# Patient Record
Sex: Female | Born: 1940 | Race: Black or African American | Hispanic: No | Smoking: Never smoker
Health system: Southern US, Community
[De-identification: ages and names within clinical notes are randomized; demographics above are authoritative.]

## PROBLEM LIST (undated history)

## (undated) DIAGNOSIS — I10 Essential (primary) hypertension: Secondary | ICD-10-CM

## (undated) DIAGNOSIS — I509 Heart failure, unspecified: Secondary | ICD-10-CM

## (undated) DIAGNOSIS — C801 Malignant (primary) neoplasm, unspecified: Secondary | ICD-10-CM

## (undated) HISTORY — DX: Heart failure, unspecified: I50.9

## (undated) HISTORY — PX: ABDOMINAL HYSTERECTOMY: SHX81

---

## 2019-03-02 ENCOUNTER — Other Ambulatory Visit: Payer: Self-pay

## 2019-03-02 ENCOUNTER — Emergency Department: Payer: Medicare Other

## 2019-03-02 ENCOUNTER — Inpatient Hospital Stay
Admission: EM | Admit: 2019-03-02 | Discharge: 2019-03-06 | DRG: 683 | Disposition: A | Discharge status: Home Health | Payer: Medicare Other | Attending: Internal Medicine | Admitting: Internal Medicine

## 2019-03-02 DIAGNOSIS — T451X5A Adverse effect of antineoplastic and immunosuppressive drugs, initial encounter: Secondary | ICD-10-CM | POA: Diagnosis present

## 2019-03-02 DIAGNOSIS — E876 Hypokalemia: Secondary | ICD-10-CM | POA: Diagnosis present

## 2019-03-02 DIAGNOSIS — D72819 Decreased white blood cell count, unspecified: Secondary | ICD-10-CM

## 2019-03-02 DIAGNOSIS — I10 Essential (primary) hypertension: Secondary | ICD-10-CM | POA: Diagnosis present

## 2019-03-02 DIAGNOSIS — D709 Neutropenia, unspecified: Secondary | ICD-10-CM | POA: Diagnosis present

## 2019-03-02 DIAGNOSIS — R531 Weakness: Secondary | ICD-10-CM

## 2019-03-02 DIAGNOSIS — N170 Acute kidney failure with tubular necrosis: Secondary | ICD-10-CM | POA: Diagnosis not present

## 2019-03-02 DIAGNOSIS — K5900 Constipation, unspecified: Secondary | ICD-10-CM | POA: Diagnosis present

## 2019-03-02 DIAGNOSIS — N179 Acute kidney failure, unspecified: Secondary | ICD-10-CM | POA: Diagnosis not present

## 2019-03-02 DIAGNOSIS — C8593 Non-Hodgkin lymphoma, unspecified, intra-abdominal lymph nodes: Secondary | ICD-10-CM

## 2019-03-02 DIAGNOSIS — E883 Tumor lysis syndrome: Secondary | ICD-10-CM | POA: Diagnosis present

## 2019-03-02 DIAGNOSIS — K567 Ileus, unspecified: Secondary | ICD-10-CM

## 2019-03-02 DIAGNOSIS — Z20828 Contact with and (suspected) exposure to other viral communicable diseases: Secondary | ICD-10-CM | POA: Diagnosis present

## 2019-03-02 DIAGNOSIS — Z79899 Other long term (current) drug therapy: Secondary | ICD-10-CM

## 2019-03-02 DIAGNOSIS — D696 Thrombocytopenia, unspecified: Secondary | ICD-10-CM | POA: Diagnosis present

## 2019-03-02 DIAGNOSIS — N136 Pyonephrosis: Secondary | ICD-10-CM | POA: Diagnosis present

## 2019-03-02 DIAGNOSIS — C8293 Follicular lymphoma, unspecified, intra-abdominal lymph nodes: Secondary | ICD-10-CM | POA: Diagnosis present

## 2019-03-02 DIAGNOSIS — Z90710 Acquired absence of both cervix and uterus: Secondary | ICD-10-CM

## 2019-03-02 HISTORY — DX: Essential (primary) hypertension: I10

## 2019-03-02 HISTORY — DX: Malignant (primary) neoplasm, unspecified: C80.1

## 2019-03-02 LAB — URINALYSIS, COMPLETE (UACMP) WITH MICROSCOPIC
Bilirubin Urine: NEGATIVE
Glucose, UA: NEGATIVE mg/dL
Ketones, ur: NEGATIVE mg/dL
Nitrite: NEGATIVE
Protein, ur: NEGATIVE mg/dL
Specific Gravity, Urine: 1.005 (ref 1.005–1.030)
pH: 8 (ref 5.0–8.0)

## 2019-03-02 LAB — BASIC METABOLIC PANEL
Anion gap: 8 (ref 5–15)
BUN: 40 mg/dL — ABNORMAL HIGH (ref 8–23)
CO2: 34 mmol/L — ABNORMAL HIGH (ref 22–32)
Calcium: 8.9 mg/dL (ref 8.9–10.3)
Chloride: 97 mmol/L — ABNORMAL LOW (ref 98–111)
Creatinine, Ser: 2.07 mg/dL — ABNORMAL HIGH (ref 0.44–1.00)
GFR calc Af Amer: 26 mL/min — ABNORMAL LOW (ref >60)
GFR calc non Af Amer: 23 mL/min — ABNORMAL LOW (ref >60)
Glucose, Bld: 143 mg/dL — ABNORMAL HIGH (ref 70–99)
Potassium: 3.4 mmol/L — ABNORMAL LOW (ref 3.5–5.1)
Sodium: 139 mmol/L (ref 135–145)

## 2019-03-02 LAB — CBC WITH DIFFERENTIAL/PLATELET
Abs Immature Granulocytes: 0.01 10*3/uL (ref 0.00–0.07)
Basophils Absolute: 0 10*3/uL (ref 0.0–0.1)
Basophils Relative: 2 %
Eosinophils Absolute: 0 10*3/uL (ref 0.0–0.5)
Eosinophils Relative: 0 %
HCT: 39.8 % (ref 36.0–46.0)
Hemoglobin: 12.6 g/dL (ref 12.0–15.0)
Immature Granulocytes: 2 %
Lymphocytes Relative: 39 %
Lymphs Abs: 0.2 10*3/uL — ABNORMAL LOW (ref 0.7–4.0)
MCH: 27 pg (ref 26.0–34.0)
MCHC: 31.7 g/dL (ref 30.0–36.0)
MCV: 85.4 fL (ref 80.0–100.0)
Monocytes Absolute: 0.1 10*3/uL (ref 0.1–1.0)
Monocytes Relative: 21 %
Neutro Abs: 0.2 10*3/uL — ABNORMAL LOW (ref 1.7–7.7)
Neutrophils Relative %: 36 %
Platelets: 37 10*3/uL — ABNORMAL LOW (ref 150–400)
RBC: 4.66 MIL/uL (ref 3.87–5.11)
RDW: 15.5 % (ref 11.5–15.5)
Smear Review: NORMAL
WBC: 0.5 10*3/uL — CL (ref 4.0–10.5)
nRBC: 0 % (ref 0.0–0.2)

## 2019-03-02 LAB — HEPATIC FUNCTION PANEL
ALT: 48 U/L — ABNORMAL HIGH (ref 0–44)
AST: 22 U/L (ref 15–41)
Albumin: 3.3 g/dL — ABNORMAL LOW (ref 3.5–5.0)
Alkaline Phosphatase: 61 U/L (ref 38–126)
Bilirubin, Direct: 0.5 mg/dL — ABNORMAL HIGH (ref 0.0–0.2)
Indirect Bilirubin: 1.1 mg/dL — ABNORMAL HIGH (ref 0.3–0.9)
Total Bilirubin: 1.6 mg/dL — ABNORMAL HIGH (ref 0.3–1.2)
Total Protein: 6.6 g/dL (ref 6.5–8.1)

## 2019-03-02 LAB — SARS CORONAVIRUS 2 BY RT PCR (HOSPITAL ORDER, PERFORMED IN ~~LOC~~ HOSPITAL LAB): SARS Coronavirus 2: NEGATIVE

## 2019-03-02 LAB — LIPASE, BLOOD: Lipase: 19 U/L (ref 11–51)

## 2019-03-02 MED ORDER — ONDANSETRON HCL 4 MG/2ML IJ SOLN
4.0000 mg | Freq: Once | INTRAMUSCULAR | Status: AC
  Administered 2019-03-02: 4 mg via INTRAVENOUS
  Filled 2019-03-02: qty 2

## 2019-03-02 MED ORDER — AMLODIPINE BESYLATE 10 MG PO TABS
10.0000 mg | ORAL_TABLET | Freq: Every day | ORAL | Status: DC
  Administered 2019-03-03 – 2019-03-06 (×4): 10 mg via ORAL
  Filled 2019-03-02 (×4): qty 1

## 2019-03-02 MED ORDER — SENNA 8.6 MG PO TABS
1.0000 | ORAL_TABLET | Freq: Every day | ORAL | Status: DC
  Administered 2019-03-02 – 2019-03-04 (×3): 8.6 mg via ORAL
  Filled 2019-03-02 (×3): qty 1

## 2019-03-02 MED ORDER — SODIUM CHLORIDE 0.9 % IV SOLN
1.0000 g | INTRAVENOUS | Status: DC
  Administered 2019-03-02 – 2019-03-05 (×4): 1 g via INTRAVENOUS
  Filled 2019-03-02 (×2): qty 1
  Filled 2019-03-02: qty 10
  Filled 2019-03-02 (×2): qty 1

## 2019-03-02 MED ORDER — LORATADINE 10 MG PO TABS
10.0000 mg | ORAL_TABLET | Freq: Every day | ORAL | Status: DC
  Administered 2019-03-03 – 2019-03-06 (×4): 10 mg via ORAL
  Filled 2019-03-02 (×4): qty 1

## 2019-03-02 MED ORDER — SODIUM CHLORIDE 0.9 % IV SOLN
INTRAVENOUS | Status: DC
  Administered 2019-03-02 – 2019-03-04 (×4): via INTRAVENOUS

## 2019-03-02 MED ORDER — LACTATED RINGERS IV BOLUS
1000.0000 mL | Freq: Once | INTRAVENOUS | Status: AC
  Administered 2019-03-02: 1000 mL via INTRAVENOUS

## 2019-03-02 NOTE — ED Notes (Signed)
Called pt's daughter to update her about the pt being moved to 1C

## 2019-03-02 NOTE — ED Triage Notes (Signed)
Patient from cancer center for concerns of constipation and possible bowel obstructions. 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 Last bowel movement last Wednesday. Family reports increased weakness and decreased PO intake.

## 2019-03-02 NOTE — ED Notes (Signed)
ED TO INPATIENT HANDOFF REPORT  ED Nurse Name and Phone #: Charlestine Night, [phone] Name/Age/Gender Christie Farmer 78 y.o. female Room/Bed: ED13A/ED13A  Code Status   Code Status: Full Code  Home/SNF/Other Home Patient oriented to: self, place, time and situation Is this baseline? Yes   Triage Complete: Triage complete  Chief Complaint Constipation   Triage Note Patient from cancer center for concerns of constipation and possible bowel obstructions. 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 Last bowel movement last Wednesday. Family reports increased weakness and decreased PO intake.   Pt comes with c/o constipation. Per family pt has not had BM since last Wednesday. Pt currently being treated for cancer at Cancer center. Per cancer center they are concerned for bowel obstruction. Pt has had loss of appetite.  Pt has also had some vomiting.   Allergies Not on File  Level of Care/Admitting Diagnosis ED Disposition    ED Disposition Condition [facility] Area: [place] [[id number]]  Level of Care: Med-Surg [16]  Covid Evaluation: Asymptomatic Screening Protocol (No Symptoms)  Diagnosis: Generalized weakness [id number]  Admitting Physician: Lang Snow [id number]  Attending Physician: Rufina Falco ACHIENG [[id number]]  PT Class (Do Not Modify): Observation [104]  PT Acc Code (Do Not Modify): Observation [10022]       B Medical/Surgery History Past Medical History:  Diagnosis Date  . Cancer ([place])   . Hypertension    Past Surgical History:  Procedure Laterality Date  . ABDOMINAL HYSTERECTOMY       A IV Location/Drains/Wounds Patient Lines/Drains/Airways Status   Active Line/Drains/Airways    Name:   Placement date:   Placement time:   Site:   Days:   Peripheral IV 03/02/19 Right Antecubital   03/02/19    0944    Antecubital   less than 1          Intake/Output Last 24 hours  Intake/Output  Summary (Last 24 hours) at 03/02/2019 1634 Last data filed at 03/02/2019 1150 Gross per 24 hour  Intake 1000 ml  Output -  Net 1000 ml    Labs/Imaging Results for orders placed or performed during the hospital encounter of 03/02/19 (from the past 48 hour(s))  CBC with Differential     Status: Abnormal   Collection Time: 03/02/19  9:34 AM  Result Value Ref Range   WBC 0.5 (LL) 4.0 - 10.5 K/uL    Comment: This critical result has verified and been called to [facility] by Silvana Newness on 08 21 2020 at 1020, and has been read back.    RBC 4.66 3.87 - 5.11 MIL/uL   Hemoglobin 12.6 12.0 - 15.0 g/dL   HCT 39.8 36.0 - 46.0 %   MCV 85.4 80.0 - 100.0 fL   MCH 27.0 26.0 - 34.0 pg   MCHC 31.7 30.0 - 36.0 g/dL   RDW 15.5 11.5 - 15.5 %   Platelets 37 (L) 150 - 400 K/uL    Comment: Immature Platelet Fraction may be clinically indicated, consider ordering this additional test [id number]    nRBC 0.0 0.0 - 0.2 %   Neutrophils Relative % 36 %   Neutro Abs 0.2 (L) 1.7 - 7.7 K/uL   Lymphocytes Relative 39 %   Lymphs Abs 0.2 (L) 0.7 - 4.0 K/uL   Monocytes Relative 21 %   Monocytes Absolute 0.1 0.1 - 1.0 K/uL   Eosinophils Relative 0 %   Eosinophils Absolute 0.0 0.0 - 0.5 K/uL  Basophils Relative 2 %   Basophils Absolute 0.0 0.0 - 0.1 K/uL   RBC Morphology MIXED RBC POPULATION    Smear Review Normal platelet morphology    Immature Granulocytes 2 %   Abs Immature Granulocytes 0.01 0.00 - 0.07 K/uL   Reactive, Benign Lymphocytes PRESENT     Comment: Performed at [facility], [address] [place], [place] [id number]  Basic metabolic panel     Status: Abnormal   Collection Time: 03/02/19  9:34 AM  Result Value Ref Range   Sodium 139 135 - 145 mmol/L   Potassium 3.4 (L) 3.5 - 5.1 mmol/L   Chloride 97 (L) 98 - 111 mmol/L   CO2 34 (H) 22 - 32 mmol/L   Glucose, Bld 143 (H) 70 - 99 mg/dL   BUN 40 (H) 8 - 23 mg/dL   Creatinine, Ser 2.07 (H) 0.44 - 1.00 mg/dL   Calcium 8.9 8.9 -  10.3 mg/dL   GFR calc non Af Amer 23 (L) >60 mL/min   GFR calc Af Amer 26 (L) >60 mL/min   Anion gap 8 5 - 15    Comment: Performed at [facility], [address] [place], [place] [address]  Hepatic function panel     Status: Abnormal   Collection Time: 03/02/19  9:34 AM  Result Value Ref Range   Total Protein 6.6 6.5 - 8.1 g/dL   Albumin 3.3 (L) 3.5 - 5.0 g/dL   AST 22 15 - 41 U/L   ALT 48 (H) 0 - 44 U/L   Alkaline Phosphatase 61 38 - 126 U/L   Total Bilirubin 1.6 (H) 0.3 - 1.2 mg/dL   Bilirubin, Direct 0.5 (H) 0.0 - 0.2 mg/dL   Indirect Bilirubin 1.1 (H) 0.3 - 0.9 mg/dL    Comment: Performed at [facility], [address] [place], [place] [address]  Lipase, blood     Status: None   Collection Time: 03/02/19  9:34 AM  Result Value Ref Range   Lipase 19 11 - 51 U/L    Comment: Performed at [facility], [address] [place], [place] [address]  Urinalysis, Complete w Microscopic     Status: Abnormal   Collection Time: 03/02/19  9:34 AM  Result Value Ref Range   Color, Urine YELLOW (A) YELLOW   APPearance CLEAR (A) CLEAR   Specific Gravity, Urine 1.005 1.005 - 1.030   pH 8.0 5.0 - 8.0   Glucose, UA NEGATIVE NEGATIVE mg/dL   Hgb urine dipstick SMALL (A) NEGATIVE   Bilirubin Urine NEGATIVE NEGATIVE   Ketones, ur NEGATIVE NEGATIVE mg/dL   Protein, ur NEGATIVE NEGATIVE mg/dL   Nitrite NEGATIVE NEGATIVE   Leukocytes,Ua TRACE (A) NEGATIVE   RBC / HPF 0-5 0 - 5 RBC/hpf   WBC, UA 11-20 0 - 5 WBC/hpf   Bacteria, UA RARE (A) NONE SEEN   Squamous Epithelial / LPF 0-5 0 - 5    Comment: Performed at [facility], [address] [place], [place] [address]  SARS Coronavirus 2 [facility] order, Performed in [place] hospital lab)     Status: None   Collection Time: 03/02/19  9:34 AM  Result Value Ref Range   SARS Coronavirus 2 NEGATIVE NEGATIVE    Comment: (NOTE) If result is NEGATIVE SARS-CoV-2 target nucleic acids are NOT DETECTED. The  SARS-CoV-2 RNA is generally detectable in upper and lower  respiratory specimens during the acute phase of infection. The lowest  concentration of SARS-CoV-2  viral copies this assay can detect is 250  copies / mL. A negative result does not preclude SARS-CoV-2 infection  and should not be used as the sole basis for treatment or other  patient management decisions.  A negative result may occur with  improper specimen collection / handling, submission of specimen other  than nasopharyngeal swab, presence of viral mutation(s) within the  areas targeted by this assay, and inadequate number of viral copies  (<250 copies / mL). A negative result must be combined with clinical  observations, patient history, and epidemiological information. If result is POSITIVE SARS-CoV-2 target nucleic acids are DETECTED. The SARS-CoV-2 RNA is generally detectable in upper and lower  respiratory specimens dur ing the acute phase of infection.  Positive  results are indicative of active infection with SARS-CoV-2.  Clinical  correlation with patient history and other diagnostic information is  necessary to determine patient infection status.  Positive results do  not rule out bacterial infection or co-infection with other viruses. If result is PRESUMPTIVE POSTIVE SARS-CoV-2 nucleic acids MAY BE PRESENT.   A presumptive positive result was obtained on the submitted specimen  and confirmed on repeat testing.  While 2019 novel coronavirus  (SARS-CoV-2) nucleic acids may be present in the submitted sample  additional confirmatory testing may be necessary for epidemiological  and / or clinical management purposes  to differentiate between  SARS-CoV-2 and other Sarbecovirus currently known to infect humans.  If clinically indicated additional testing with an alternate test  methodology [phone]) is advised. The SARS-CoV-2 RNA is generally  detectable in upper and lower respiratory sp ecimens during the acute   phase of infection. The expected result is Negative. Fact Sheet for Patients:  StrictlyIdeas.no Fact Sheet for Healthcare Providers: BankingDealers.co.za This test is not yet approved or cleared by the [place] FDA and has been authorized for detection and/or diagnosis of SARS-CoV-2 by FDA under an Emergency Use Authorization (EUA).  This EUA will remain in effect (meaning this test can be used) for the duration of the COVID-19 declaration under Section 564(b)(1) of the Act, 21 U.S.C. section 360bbb-3(b)(1), unless the authorization is terminated or revoked sooner. Performed at [facility], [address], [place], [place] [address]    Ct Abdomen Pelvis Wo Contrast  Result Date: 03/02/2019 CLINICAL DATA:  Constipation, possible bowel obstruction. EXAM: CT ABDOMEN AND PELVIS WITHOUT CONTRAST TECHNIQUE: Multidetector CT imaging of the abdomen and pelvis was performed following the standard protocol without IV contrast. COMPARISON:  None. FINDINGS: Lower chest: No acute abnormality. Hepatobiliary: No focal liver abnormality is seen. No gallstones, gallbladder wall thickening, or biliary dilatation. Pancreas: Unremarkable. No pancreatic ductal dilatation or surrounding inflammatory changes. Spleen: Normal in size without focal abnormality. Adrenals/Urinary Tract: Adrenal glands appear normal. Mild bilateral hydroureteronephrosis is noted without evidence of obstructing calculus. Minimal perinephric stranding is noted. No ureteral calculus is noted. Urinary bladder is unremarkable. Stomach/Bowel: Stomach is within normal limits. Appendix appears normal. No evidence of bowel wall thickening, distention, or inflammatory changes. Vascular/Lymphatic: Atherosclerosis of abdominal aorta is noted without aneurysm formation. Extensive retroperitoneal adenopathy is noted consistent with malignancy or metastatic disease; largest lymph node measures  2.8 cm in the right periaortic region. Also noted is extensive adenopathy in the iliac and inguinal regions; with the largest lymph node measuring 12 mm in the right external iliac region. Reproductive: Status post hysterectomy. No adnexal masses. Other: No abdominal wall hernia or abnormality. No abdominopelvic ascites. Musculoskeletal: No acute or significant osseous findings. IMPRESSION: Extensive  adenopathy is noted in the retroperitoneal, bilateral iliac and inguinal regions concerning for malignancy or metastatic disease. Mild bilateral hydroureteronephrosis is noted without evidence of obstructing calculus. Minimal perinephric stranding is noted. Aortic Atherosclerosis (ICD10-I70.0). Electronically Signed   By: Marijo Conception M.D.   On: 03/02/2019 10:50    Pending Labs Unresulted Labs (From admission, onward)    Start     Ordered   03/02/19 1524  Urine Culture  Add-on,   AD     03/02/19 1524          Vitals/Pain Today's Vitals   03/02/19 0842 03/02/19 1130 03/02/19 1230 03/02/19 1245  BP: (!) 147/70 (!) 141/81 123/78   Pulse: 60 (!) 111  (!) 114  Resp: 18     Temp: 97.7 F (36.5 C)     TempSrc: Oral     SpO2: 96% 97% 92%   Weight:      Height:      PainSc:        Isolation Precautions No active isolations  Medications Medications  amLODipine (NORVASC) tablet 10 mg (10 mg Oral Not Given 03/02/19 1250)  loratadine (CLARITIN) tablet 10 mg (10 mg Oral Not Given 03/02/19 1251)  0.9 %  sodium chloride infusion (has no administration in time range)  cefTRIAXone (ROCEPHIN) 1 g in sodium chloride 0.9 % 100 mL IVPB (has no administration in time range)  senna (SENOKOT) tablet 8.6 mg (has no administration in time range)  lactated ringers bolus 1,000 mL (0 mLs Intravenous Stopped 03/02/19 1150)  ondansetron (ZOFRAN) injection 4 mg (4 mg Intravenous Given 03/02/19 0945)    Mobility walks Low fall risk   Focused Assessments Cardiac Assessment Handoff:    No results found for:  CKTOTAL, CKMB, CKMBINDEX, TROPONINI No results found for: DDIMER Does the Patient currently have chest pain? No   , Pulmonary Assessment Handoff:  Lung sounds:            R Recommendations: See Admitting Provider Note  Report given to:   Additional Notes:

## 2019-03-02 NOTE — ED Provider Notes (Signed)
[facility] Emergency Department Provider Note   ____________________________________________   First MD Initiated Contact with Patient 03/02/19 [phone]     (approximate)  I have reviewed the triage vital signs and the nursing notes.   HISTORY  Chief Complaint Constipation    HPI Christie Farmer is a 78 y.o. female with past medical history of lymphoma and hypertension who presents to the ED complaining of constipation and vomiting.  Patient recently started chemotherapy for her lymphoma, with her first dose 9 days ago.  She states she has been constipated since then, denies abdominal pain but has felt nauseous with very poor appetite.  She vomited twice a couple of days ago, but has not vomited since then.  She has tried prune juice and mag citrate at home without relief.  She is not currently taking any pain medication.        Past Medical History:  Diagnosis Date  . Cancer ([place])   . Hypertension     Patient Active Problem List   Diagnosis Date Noted  . Generalized weakness 03/02/2019    Past Surgical History:  Procedure Laterality Date  . ABDOMINAL HYSTERECTOMY      Prior to Admission medications   Medication Sig Start Date End Date Taking? Authorizing Provider  amLODipine (NORVASC) 10 MG tablet Take 10 mg by mouth daily. 02/24/19 02/24/20 Yes [provider]  loratadine (CLARITIN) 10 MG tablet Take 10 mg by mouth daily.    [provider]    Allergies Patient has no allergy information on record.  No family history on file.  Social History Social History   Tobacco Use  . Smoking status: Not on file  Substance Use Topics  . Alcohol use: Not on file  . Drug use: Not on file    Review of Systems  Constitutional: No fever/chills Eyes: No visual changes. ENT: No sore throat. Cardiovascular: Denies chest pain. Respiratory: Denies shortness of breath. Gastrointestinal: No abdominal pain.  Positive for nausea and  vomiting.  No diarrhea.  Positive for constipation. Genitourinary: Negative for dysuria. Musculoskeletal: Negative for back pain. Skin: Negative for rash. Neurological: Negative for headaches, focal weakness or numbness.  ____________________________________________   PHYSICAL EXAM:  VITAL SIGNS: ED Triage Vitals  Enc Vitals Group     BP 03/02/19 0842 (!) 147/70     Pulse Rate 03/02/19 0842 60     Resp 03/02/19 0842 18     Temp 03/02/19 0842 97.7 F (36.5 C)     Temp Source 03/02/19 0842 Oral     SpO2 03/02/19 0842 96 %     Weight 03/02/19 0839 152 lb (68.9 kg)     Height 03/02/19 0839 5\' 7"  (1.702 m)     Head Circumference --      Peak Flow --      Pain Score 03/02/19 0839 0     Pain Loc --      Pain Edu? --      Excl. in [place]? --     Constitutional: Alert and oriented. Eyes: Conjunctivae are normal. Head: Atraumatic. Nose: No congestion/rhinnorhea. Mouth/Throat: Mucous membranes are moist. Neck: Normal ROM Cardiovascular: Normal rate, regular rhythm. Grossly normal heart sounds.  Right chest wall Mediport site clean, dry, and intact. Respiratory: Normal respiratory effort.  No retractions. Lungs CTAB. Gastrointestinal: Soft and nontender. No distention. Genitourinary: deferred Musculoskeletal: No lower extremity tenderness nor edema. Neurologic:  Normal speech and language. No gross focal neurologic deficits are appreciated. Skin:  Skin is  warm, dry and intact. No rash noted. Psychiatric: Mood and affect are normal. Speech and behavior are normal.  ____________________________________________   LABS (all labs ordered are listed, but only abnormal results are displayed)  Labs Reviewed  CBC WITH DIFFERENTIAL/PLATELET - Abnormal; Notable for the following components:      Result Value   WBC 0.5 (*)    Platelets 37 (*)    Neutro Abs 0.2 (*)    Lymphs Abs 0.2 (*)    All other components within normal limits  BASIC METABOLIC PANEL - Abnormal; Notable for the  following components:   Potassium 3.4 (*)    Chloride 97 (*)    CO2 34 (*)    Glucose, Bld 143 (*)    BUN 40 (*)    Creatinine, Ser 2.07 (*)    GFR calc non Af Amer 23 (*)    GFR calc Af Amer 26 (*)    All other components within normal limits  HEPATIC FUNCTION PANEL - Abnormal; Notable for the following components:   Albumin 3.3 (*)    ALT 48 (*)    Total Bilirubin 1.6 (*)    Bilirubin, Direct 0.5 (*)    Indirect Bilirubin 1.1 (*)    All other components within normal limits  URINALYSIS, COMPLETE (UACMP) WITH MICROSCOPIC - Abnormal; Notable for the following components:   Color, Urine YELLOW (*)    APPearance CLEAR (*)    Hgb urine dipstick SMALL (*)    Leukocytes,Ua TRACE (*)    Bacteria, UA RARE (*)    All other components within normal limits  SARS CORONAVIRUS 2 (HOSPITAL ORDER, [place] [facility])  URINE CULTURE  LIPASE, BLOOD     PROCEDURES  Procedure(s) performed (including Critical Care):  Procedures   ____________________________________________   INITIAL IMPRESSION / ASSESSMENT AND PLAN / ED COURSE       78 year old female, recently starting chemotherapy for lymphoma, presenting to the ED for constipation along with nausea and vomiting for about the past week.  She has a benign abdominal exam and continues to pass gas, do not suspect high-grade bowel obstruction, but given her high risk will check CT for other acute process.  Check labs and treat symptomatically, will need rectal exam to ensure no fecal impaction.  No fecal impaction noted on rectal exam.  CT negative for acute process, does show evidence of patient's known lymphoma.  Labs significant for AKI in patient with no history of chronic kidney disease.  Likely secondary to dehydration, will hydrate with IV fluids.  Case discussed with hospitalist, who accepts patient for admission.      ____________________________________________   FINAL CLINICAL IMPRESSION(S) / ED  DIAGNOSES  Final diagnoses:  AKI (acute kidney injury) ([place])  Leukopenia, unspecified type  Lymphoma of intra-abdominal lymph nodes, unspecified lymphoma type [facility])     ED Discharge Orders    None       Note:  This document was prepared using Dragon voice recognition software and may include unintentional dictation errors.   Blake Divine, MD 03/02/19 [phone]

## 2019-03-02 NOTE — Care Management Obs Status (Signed)
[place] NOTIFICATION   Patient Details  Name: Anael Renz MRN: [id number] Date of Birth: Feb 14, 1941   Medicare Observation Status Notification Given:  Yes    Katrina Stack, RN 03/02/2019, 2:26 PM

## 2019-03-02 NOTE — ED Notes (Signed)
Date and time results received: 03/02/19 1027  Test: WBC Critical Value: 0.5  Name of Provider Notified: Dr. Charna Archer, MD

## 2019-03-02 NOTE — ED Notes (Signed)
Pt placed on monitor, RN aware of pt in room

## 2019-03-02 NOTE — TOC Initial Note (Signed)
Transition of Care [facility]) - Initial/Assessment Note    Patient Details  Name: Rollene Gelpi MRN: [id number] Date of Birth: 10/05/1940  Transition of Care [facility]) CM/SW Contact:    Katrina Stack, RN Phone Number: 03/02/2019, 2:30 PM  Clinical Narrative:                 Assessed due to moderate risk score.  Daughter [name] is primary caregiver and at present time, patient is living with her.  She is followed by [facility]/Hematology for her lymphoma. Being placed in observation for weakness. Denies issues accessing medical care, obtaining medications or with transportation.  No discharge needs identified at present by care manager or members of care team   Expected Discharge Plan: Home/Self Care     Patient Goals and CMS Choice     Choice offered to / list presented to : NA  Expected Discharge Plan and Services Expected Discharge Plan: Home/Self Care In-house Referral: NA Discharge Planning Services: NA   Living arrangements for the past 2 months: Single Family Home                                      Prior Living Arrangements/Services Living arrangements for the past 2 months: Single Family Home Lives with:: Adult Children Patient language and need for interpreter reviewed:: Yes        Need for Family Participation in Patient Care: Yes (Comment) Care giver support system in place?: Yes (comment) Current home services: Other (comment)(None) Criminal Activity/Legal Involvement Pertinent to Current Situation/Hospitalization: No - Comment as needed  Activities of Daily Living      Permission Sought/Granted                  Emotional Assessment Appearance:: Appears stated age Attitude/Demeanor/Rapport: Engaged Affect (typically observed): Calm Orientation: : Oriented to Self, Oriented to Place, Oriented to  Time, Oriented to Situation Alcohol / Substance Use: Not Applicable Psych Involvement: No (comment)  Admission diagnosis:  Constipation   Patient Active Problem List   Diagnosis Date Noted  . Generalized weakness 03/02/2019   PCP:  Patient, No Pcp Per Pharmacy:   [facility] [id number], [place] [address] Phone: [phone] Fax: [phone]     Social Determinants of Health (SDOH) Interventions    Readmission Risk Interventions No flowsheet data found.

## 2019-03-02 NOTE — ED Notes (Signed)
Rectal Exam performed by Dr. Charna Archer, this RN at bedside.

## 2019-03-02 NOTE — H&P (Signed)
[facility] NAME: Christie Farmer    MR#:  [id number]  DATE OF BIRTH:  September 25, 1940  DATE OF ADMISSION:  03/02/2019  PRIMARY CARE PHYSICIAN: Patient, No Pcp Per   REQUESTING/REFERRING PHYSICIAN: Blake Divine, MD  CHIEF COMPLAINT:   Chief Complaint  Patient presents with   Constipation    HISTORY OF PRESENT ILLNESS:   78 year old female with past medical history of hypertension and recent diagnosis of lymphadenopathy concerning for lymphoma resenting to the ED from urgent care with complaints of constipation and possible bowel obstruction.  Patient was recently discharged from [facility] on 02/17/2019 with diffuse lymphadenopathy concerning for lymphoma, AKI due to diffuse LAD, with mass-effect on bilateral ureters, and hypertension.  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  PET scan showed multifocal diffuse adenopathy therefore decision was made to treat as though follicular lymphoma.  Patient was discharged with prednisone, allopurinol to complete a 7-day course for cycle 1, and Zyprexa 10 mg nightly for 3 nights for nausea and vomiting.  She was started on amlodipine for hypertension during admission and to continue on discharge.  Patient received Udenyca on 8/16 and followed up with Dr. Kriste Basque oncologist set for 04/05/2019.  Since discharge, patient's daughter state she has been weak with poor p.o. intake.  Patient's daughter called [facility] to report that patient has not had a bowel movement since last Wednesday when she was in the hospital, she has tried warm and cold prune juice that made her sick to her stomach, magnesium citrate, suppository, and enema and laxative with no result except for passing gas.  The presented to [facility] ED however they left AMA due to long wait in the ED.  Patient again presented to urgent care with similar symptoms and was referred to the ED for  further evaluation.  On arrival to the ED, she was afebrile with blood pressure 147/70 mm Hg and pulse rate 60 beats/min. There were no focal neurological deficits; she was alert and oriented x 4.  Initial labs showed WBC 0.5, platelets 37, potassium 3.4, BUN 40, creatinine 2.07, total bili 1.6.  UA shows trace leukocytes with rare bacteria.  CT abdomen and pelvis and confirms extensive adenopathy in the retroperitoneal, bilateral iliac and inguinal regions concerning for malignancy, bilateral hydro-nephrosis also noted.  Given this finding patient will be admitted for further management and to hospitalist service.  PAST MEDICAL HISTORY:   Past Medical History:  Diagnosis Date   Cancer ([place])    Hypertension     PAST SURGICAL HISTORY:   Past Surgical History:  Procedure Laterality Date   ABDOMINAL HYSTERECTOMY      SOCIAL HISTORY:   Social History   Tobacco Use   Smoking status: Not on file  Substance Use Topics   Alcohol use: Not on file    FAMILY HISTORY:  No family history on file.  DRUG ALLERGIES:  Not on File  REVIEW OF SYSTEMS:   Review of Systems  Constitutional: Positive for malaise/fatigue. Negative for chills, fever and weight loss.  HENT: Negative for congestion, hearing loss and sore throat.   Eyes: Negative for blurred vision and double vision.  Respiratory: Negative for cough, shortness of breath and wheezing.   Cardiovascular: Negative for chest pain, palpitations, orthopnea and leg swelling.  Gastrointestinal: Positive for abdominal pain, heartburn, nausea and vomiting. Negative for diarrhea.  Genitourinary: Positive for flank pain. Negative for dysuria and urgency.  Musculoskeletal: Positive for back pain. Negative for myalgias.  Skin: Negative for rash.  Neurological: Negative for dizziness, sensory change, speech change, focal weakness and headaches.  Psychiatric/Behavioral: Negative for depression.   MEDICATIONS AT HOME:   Prior to Admission  medications   Medication Sig Start Date End Date Taking? Authorizing Provider  amLODipine (NORVASC) 10 MG tablet Take 10 mg by mouth daily. 02/24/19 02/24/20 Yes [provider]  loratadine (CLARITIN) 10 MG tablet Take 10 mg by mouth daily.    [provider]      VITAL SIGNS:  Blood pressure 123/78, pulse (!) 114, temperature 97.7 F (36.5 C), temperature source Oral, resp. rate 18, height 5\' 7"  (1.702 m), weight 68.9 kg, SpO2 92 %.  PHYSICAL EXAMINATION:   Physical Exam  GENERAL:  78 y.o.-year-old patient lying in the bed with no acute distress.  EYES: Pupils equal, round, reactive to light and accommodation. No scleral icterus. Extraocular muscles intact.  HEENT: Head atraumatic, normocephalic. Oropharynx and nasopharynx clear.  NECK:  Supple, no jugular venous distention. No thyroid enlargement, no tenderness.  LUNGS: Normal breath sounds bilaterally, no wheezing, rales,rhonchi or crepitation. No use of accessory muscles of respiration.  CARDIOVASCULAR: S1, S2 normal. No murmurs, rubs, or gallops.  ABDOMEN: Soft, nontender, nondistended. Bowel sounds present. No organomegaly or mass.  EXTREMITIES: No pedal edema, cyanosis, or clubbing. No rash or lesions. + pedal pulses MUSCULOSKELETAL: Normal bulk, and power was 5+ grip and elbow, knee, and ankle flexion and extension bilaterally.  NEUROLOGIC:Alert and oriented x 3. CN 2-12 intact. Sensation to light touch and cold stimuli intact bilaterally. DTR's (biceps, patellar, and achilles) 2+ and symmetric throughout. Gait not tested due to safety concern. PSYCHIATRIC: The patient is alert and oriented x 3.  SKIN: No obvious rash, lesion, or ulcer.   DATA REVIEWED:  LABORATORY PANEL:   CBC Recent Labs  Lab 03/02/19 0934  WBC 0.5*  HGB 12.6  HCT 39.8  PLT 37*   ------------------------------------------------------------------------------------------------------------------  Chemistries  Recent Labs  Lab  03/02/19 0934  NA 139  K 3.4*  CL 97*  CO2 34*  GLUCOSE 143*  BUN 40*  CREATININE 2.07*  CALCIUM 8.9  AST 22  ALT 48*  ALKPHOS 61  BILITOT 1.6*   ------------------------------------------------------------------------------------------------------------------  Cardiac Enzymes No results for input(s): TROPONINI in the last 168 hours. ------------------------------------------------------------------------------------------------------------------  RADIOLOGY:  Ct Abdomen Pelvis Wo Contrast  Result Date: 03/02/2019 CLINICAL DATA:  Constipation, possible bowel obstruction. EXAM: CT ABDOMEN AND PELVIS WITHOUT CONTRAST TECHNIQUE: Multidetector CT imaging of the abdomen and pelvis was performed following the standard protocol without IV contrast. COMPARISON:  None. FINDINGS: Lower chest: No acute abnormality. Hepatobiliary: No focal liver abnormality is seen. No gallstones, gallbladder wall thickening, or biliary dilatation. Pancreas: Unremarkable. No pancreatic ductal dilatation or surrounding inflammatory changes. Spleen: Normal in size without focal abnormality. Adrenals/Urinary Tract: Adrenal glands appear normal. Mild bilateral hydroureteronephrosis is noted without evidence of obstructing calculus. Minimal perinephric stranding is noted. No ureteral calculus is noted. Urinary bladder is unremarkable. Stomach/Bowel: Stomach is within normal limits. Appendix appears normal. No evidence of bowel wall thickening, distention, or inflammatory changes. Vascular/Lymphatic: Atherosclerosis of abdominal aorta is noted without aneurysm formation. Extensive retroperitoneal adenopathy is noted consistent with malignancy or metastatic disease; largest lymph node measures 2.8 cm in the right periaortic region. Also noted is extensive adenopathy in the iliac and inguinal regions; with the largest lymph node measuring 12 mm in the right external iliac region. Reproductive: Status post hysterectomy. No adnexal  masses. Other: No abdominal wall hernia or abnormality. No abdominopelvic ascites. Musculoskeletal: No acute or significant osseous findings. IMPRESSION: Extensive adenopathy is noted in the retroperitoneal, bilateral iliac and inguinal regions concerning for malignancy or metastatic disease. Mild bilateral hydroureteronephrosis is noted without evidence of obstructing calculus. Minimal perinephric stranding is noted. Aortic Atherosclerosis (ICD10-I70.0). Electronically Signed   By: Marijo Conception M.D.   On: 03/02/2019 10:50    EKG:  EKG: there are no previous tracings available for comparison.  IMPRESSION AND PLAN:   79 y.o. female past medical history of hypertension and recent diagnosis of lymphadenopathy concerning for lymphoma resenting to the ED from urgent care with complaints of constipation and possible bowel obstruction.  1. Constipation -initial concerns for bowel obstruction -Admit to MedSurg unit -CT abdomen and pelvis negative for obstruction -Continue bowel regimen  2. Acute kidney injury -likely related to diffuse lymphadenopathy with mass-effect on bilateral ureters -Renal ultrasound on 8/11 showed mild bilateral hydronephrosis -Avoid nephrotoxins -IV fluids hydration -Continue to monitor  3. UTI -UA shows UTI -Urine cultures pending -Start treatment with ceftriaxone  4. Hypertension -Continue amlodipine  -PRN hydralazine  5. Follicular lymphoma -status post biopsy chemotherapy and port placement -Received first dose of Udenyca on 8/16 -Follow-up with Dr. 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 Leukopenia -likely due to malignancy status post treatment with Udenyca - Continue to monitor  9. DVT prophylaxis - Hold anti-coagulation for thrombocytopenia    All the records are reviewed and  case discussed with ED provider. Management plans discussed with the patient, family and they are in agreement.  CODE STATUS: FULL  TOTAL TIME TAKING CARE OF THIS PATIENT: 50 minutes.    on 03/02/2019 at 2:47 PM   Rufina Falco, DNP, FNP-BC [facility] Nurse Practitioner Between 7am to 6pm - Pager [phone]  After 6pm go to [email or web address] - password [email or web address] [facility]  Office  [phone]  CC: Primary care physician; Patient, No Pcp Per

## 2019-03-02 NOTE — Plan of Care (Signed)
Pt admitted from the ED. VSS. Denies pain.  

## 2019-03-02 NOTE — ED Triage Notes (Signed)
Pt comes with c/o constipation. Per family pt has not had BM since last Wednesday. Pt currently being treated for cancer at Cancer center. Per cancer center they are concerned for bowel obstruction. Pt has had loss of appetite.  Pt has also had some vomiting.

## 2019-03-03 MED ORDER — LACTULOSE 10 GM/15ML PO SOLN
20.0000 g | Freq: Two times a day (BID) | ORAL | Status: DC | PRN
  Administered 2019-03-04 – 2019-03-05 (×2): 20 g via ORAL
  Filled 2019-03-03 (×2): qty 30

## 2019-03-03 MED ORDER — POLYETHYLENE GLYCOL 3350 17 G PO PACK
17.0000 g | PACK | Freq: Every day | ORAL | Status: DC
  Administered 2019-03-03 – 2019-03-05 (×3): 17 g via ORAL
  Filled 2019-03-03 (×3): qty 1

## 2019-03-03 NOTE — Progress Notes (Signed)
[facility] [place] NAME: Christie Farmer    MR#:  [id number]  DATE OF BIRTH:  Apr 08, 1941  SUBJECTIVE:  CHIEF COMPLAINT:   Chief Complaint  Patient presents with   Constipation   -Has generalized weakness. -Received her first cycle of R-CHOP on 02/22/2019. -Also being treated for UTI  REVIEW OF SYSTEMS:  Review of Systems  Constitutional: Positive for malaise/fatigue. Negative for chills and fever.  Eyes: Negative for blurred vision and double vision.  Respiratory: Negative for cough, shortness of breath and wheezing.   Cardiovascular: Negative for chest pain and palpitations.  Gastrointestinal: Negative for abdominal pain, constipation, diarrhea, nausea and vomiting.  Genitourinary: Negative for dysuria.  Musculoskeletal: Positive for back pain. Negative for myalgias.  Neurological: Positive for weakness. Negative for dizziness, speech change, focal weakness, seizures and headaches.  Psychiatric/Behavioral: Negative for depression.    DRUG ALLERGIES:  No Known Allergies  VITALS:  Blood pressure 140/74, pulse (!) 112, temperature 98.2 F (36.8 C), temperature source Oral, resp. rate 18, height 5\' 7"  (1.702 m), weight 68.3 kg, SpO2 94 %.  PHYSICAL EXAMINATION:  Physical Exam  GENERAL:  78 y.o.-year-old patient lying in the bed with no acute distress.  EYES: Pupils equal, round, reactive to light and accommodation. No scleral icterus. Extraocular muscles intact.  HEENT: Head atraumatic, normocephalic. Oropharynx and nasopharynx clear.  NECK:  Supple, no jugular venous distention. No thyroid enlargement, no tenderness.  LUNGS: Normal breath sounds bilaterally, no wheezing, rales,rhonchi or crepitation. No use of accessory muscles of respiration.  Decreased bibasilar breath sounds CARDIOVASCULAR: S1, S2 normal. No murmurs, rubs, or gallops.  ABDOMEN: Soft, nontender, nondistended. Bowel sounds present. No organomegaly or mass.    EXTREMITIES: No pedal edema, cyanosis, or clubbing.  NEUROLOGIC: Cranial nerves II through XII are intact. Muscle strength 5/5 in all extremities. Sensation intact. Gait not checked.  PSYCHIATRIC: The patient is alert and oriented x 3.  SKIN: No obvious rash, lesion, or ulcer.    LABORATORY PANEL:   CBC Recent Labs  Lab 03/02/19 0934  WBC 0.5*  HGB 12.6  HCT 39.8  PLT 37*   ------------------------------------------------------------------------------------------------------------------  Chemistries  Recent Labs  Lab 03/02/19 0934  NA 139  K 3.4*  CL 97*  CO2 34*  GLUCOSE 143*  BUN 40*  CREATININE 2.07*  CALCIUM 8.9  AST 22  ALT 48*  ALKPHOS 61  BILITOT 1.6*   ------------------------------------------------------------------------------------------------------------------  Cardiac Enzymes No results for input(s): TROPONINI in the last 168 hours. ------------------------------------------------------------------------------------------------------------------  RADIOLOGY:  Ct Abdomen Pelvis Wo Contrast  Result Date: 03/02/2019 CLINICAL DATA:  Constipation, possible bowel obstruction. EXAM: CT ABDOMEN AND PELVIS WITHOUT CONTRAST TECHNIQUE: Multidetector CT imaging of the abdomen and pelvis was performed following the standard protocol without IV contrast. COMPARISON:  None. FINDINGS: Lower chest: No acute abnormality. Hepatobiliary: No focal liver abnormality is seen. No gallstones, gallbladder wall thickening, or biliary dilatation. Pancreas: Unremarkable. No pancreatic ductal dilatation or surrounding inflammatory changes. Spleen: Normal in size without focal abnormality. Adrenals/Urinary Tract: Adrenal glands appear normal. Mild bilateral hydroureteronephrosis is noted without evidence of obstructing calculus. Minimal perinephric stranding is noted. No ureteral calculus is noted. Urinary bladder is unremarkable. Stomach/Bowel: Stomach is within normal limits. Appendix  appears normal. No evidence of bowel wall thickening, distention, or inflammatory changes. Vascular/Lymphatic: Atherosclerosis of abdominal aorta is noted without aneurysm formation. Extensive retroperitoneal adenopathy is noted consistent with malignancy or metastatic disease; largest lymph node measures 2.8 cm in the right periaortic region.  Also noted is extensive adenopathy in the iliac and inguinal regions; with the largest lymph node measuring 12 mm in the right external iliac region. Reproductive: Status post hysterectomy. No adnexal masses. Other: No abdominal wall hernia or abnormality. No abdominopelvic ascites. Musculoskeletal: No acute or significant osseous findings. IMPRESSION: Extensive adenopathy is noted in the retroperitoneal, bilateral iliac and inguinal regions concerning for malignancy or metastatic disease. Mild bilateral hydroureteronephrosis is noted without evidence of obstructing calculus. Minimal perinephric stranding is noted. Aortic Atherosclerosis (ICD10-I70.0). Electronically Signed   By: Marijo Conception M.D.   On: 03/02/2019 10:50    EKG:  No orders found for this or any previous visit.  ASSESSMENT AND PLAN:   78 year old female with past medical history significant for hypertension and recent diagnosis of follicular lymphoma 2 weeks ago presents to hospital secondary to generalized weakness and constipation.  1.  Generalized weakness-likely secondary to chemotherapy.  Also patient has UTI. -Physical therapy consulted  2.  Acute cystitis-urine cultures have been ordered.  Patient currently on Rocephin.  Stop after 3 days  3.  Severe neutropenia-patient has received chemotherapy on 02/22/2019 with R-CHOP, she had a pad Neulasta shot on 02/25/2019.  Continue to monitor WBCs.  No intervention at this time Monitor for any fevers.  4.  Thrombocytopenia-acute again, likely secondary to recent chemotherapy.  Continue to monitor.  No transfusion unless platelets are less than  10 K or symptomatic with bleed  5.  Acute renal failure-renal function was [facility] at [facility] 1 week ago.   -CT of the abdomen showing retroperitoneal lymphadenopathy, however no complete obstruction noted.  Has mild hydroureters. -Poor oral intake, likely prerenal causes.  IV fluids and monitor  6.  Constipation-medications have been added  7.  Follicular lymphoma-diagnosed 2 weeks ago after an axillary lymph node biopsy on 02/19/2019, Port-A-Cath placed on 02/22/2019 and started on first cycle chemotherapy with R-CHOP on 02/22/2019.  Received Neulasta on 02/25/2019.  Continue outpatient follow-up.  Second cycle of R-CHOP scheduled for 03/16/2019.  8.  DVT prophylaxis-teds and SCDs only given thrombocytopenia    All the records are reviewed and case discussed with Care Management/Social Workerr. Management plans discussed with the patient, family and they are in agreement.  CODE STATUS: Full code  TOTAL TIME TAKING CARE OF THIS PATIENT: 37 minutes.   POSSIBLE D/C IN 2 DAYS, DEPENDING ON CLINICAL CONDITION.   Gladstone Lighter M.D on 03/03/2019 at 11:52 AM  Between 7am to 6pm - Pager - [phone]  After 6pm go to [email or web address] - password [email or web address]  Office  [phone]  CC: Primary care physician; Patient, No Pcp Per

## 2019-03-03 NOTE — Evaluation (Signed)
Physical Therapy Evaluation Patient Details Name: Christie Farmer MRN: [id number] DOB: 07-25-1940 Today's Date: 03/03/2019   History of Present Illness  presented to ER secondary to constipation, possible bowel obstruction; admitted for management of AKI, UTI.  CT abdomen negative for obstruction; significant for extensive adenopathy related to malignancy/metastatic disease.  Of note, recent diagnosis of follicular lymphoma with initial dose of chemo (8/16) via R chest port; next dose scheduled for 9/4.  Clinical Impression  Upon evaluation, patient alert and oriented; follows commands and demonstrates good effort with all mobility tasks.  bilat UE/LE strength and ROM grossly symmetrical and WFL; no focal weakness, sensory or coordination deficit appreciated.  Demonstrates mild balance deficits, gait deviations during mobility tasks without assist device (requiring min/mod assist from therapist for balance recovery).  Performance enhanced with use of RW, and patient subjectively reporting improved comfort/confidence with use of RW.  Do recommend continued use with all mobility at this time and upon discharge. Would benefit from skilled PT to address above deficits and promote optimal return to PLOF; Recommend transition to [facility] upon discharge from acute hospitalization.     Follow Up Recommendations Home health PT(was participating with HHPT prior to admission; wishes to resume)    Equipment Recommendations  Rolling walker with 5" wheels    Recommendations for Other Services       Precautions / Restrictions Precautions Precautions: Fall Restrictions Weight Bearing Restrictions: No      Mobility  Bed Mobility Overal bed mobility: Modified Independent                Transfers Overall transfer level: Needs assistance Equipment used: None Transfers: Sit to/from Stand Sit to Stand: Min guard;Min assist            Ambulation/Gait Ambulation/Gait assistance: Min assist;Mod  assist Gait Distance (Feet): 90 Feet Assistive device: None       General Gait Details: reciprocal stepping pattern with narrowed BOS, mod gait deviation (and LOB) with head turns, requiring min/mod assist for correction  Stairs            Wheelchair Mobility    Modified Rankin (Stroke Patients Only)       Balance Overall balance assessment: Needs assistance Sitting-balance support: No upper extremity supported;Feet supported Sitting balance-Leahy Scale: Good     Standing balance support: No upper extremity supported Standing balance-Leahy Scale: Poor                               Pertinent Vitals/Pain Pain Assessment: No/denies pain    Home Living Family/patient expects to be discharged to:: Private residence Living Arrangements: (at baseline, lives alone, but living (and planning to discharge) with daughter while undergoing chemo) Available Help at Discharge: Family;Available 24 hours/day(daughter currently working form home) Type of Home: House Home Access: Stairs to enter Entrance Stairs-Rails: None Entrance Stairs-Number of Steps: 4-5 Home Layout: Two level;Able to live on main level with bedroom/bathroom(no essential needs on upper level of home) Home Equipment: None      Prior Function Level of Independence: Independent         Comments: Indep with ADLs, household and community mobilization without assist device; denies fall history.     Hand Dominance        Extremity/Trunk Assessment   Upper Extremity Assessment Upper Extremity Assessment: Overall WFL for tasks assessed    Lower Extremity Assessment Lower Extremity Assessment: Overall WFL for tasks assessed(grossly at least 4/5 throughout; no  focal weakness appreciated)       Communication   Communication: No difficulties  Cognition Arousal/Alertness: Awake/alert Behavior During Therapy: WFL for tasks assessed/performed Overall Cognitive Status: Within Functional Limits  for tasks assessed                                        General Comments      Exercises Other Exercises Other Exercises: 35' with RW, cga-improved gait symmetry, fluidity and overall safety; patient voicing subjective comfort/confidence with use of RW.  do recommend continued use at this time. Other Exercises: Toilet transfer, ambulatory with RW, cga/close sup; sit/stand from standard toilet with grab bar, cga/close sup; standing balance at sink, close sup, with functional reach 3-4" from immediate BOS (requiring contralateral UE stabilization for extension beyond) Other Exercises: Seated LE therex, 1x10: ankle pumps, LAQs, marching; supine LE therex, 1x10: ankle pumps, bridging, SLR.  Min cuing/assist for technique.  Encouraged performance outside of therapy for LE strength/endurance. Patietn voiced understanding.   Assessment/Plan    PT Assessment Patient needs continued PT services  PT Problem List Decreased strength;Decreased range of motion;Decreased activity tolerance;Decreased balance;Decreased mobility       PT Treatment Interventions DME instruction;Gait training;Stair training;Functional mobility training;Therapeutic activities;Therapeutic exercise;Balance training;Patient/family education    PT Goals (Current goals can be found in the Care Plan section)  Acute Rehab PT Goals Patient Stated Goal: to return home with daughter PT Goal Formulation: With patient/family Time For Goal Achievement: 03/17/19 Potential to Achieve Goals: Good    Frequency Min 2X/week   Barriers to discharge        Co-evaluation               AM-PAC PT "6 Clicks" Mobility  Outcome Measure Help needed turning from your back to your side while in a flat bed without using bedrails?: None Help needed moving from lying on your back to sitting on the side of a flat bed without using bedrails?: None Help needed moving to and from a bed to a chair (including a wheelchair)?: A  Little Help needed standing up from a chair using your arms (e.g., wheelchair or bedside chair)?: A Little Help needed to walk in hospital room?: A Little Help needed climbing 3-5 steps with a railing? : A Little 6 Click Score: 20    End of Session Equipment Utilized During Treatment: Gait belt Activity Tolerance: Patient tolerated treatment well Patient left: in bed;with call bell/phone within reach;with bed alarm set;with family/visitor present Nurse Communication: Mobility status PT Visit Diagnosis: Muscle weakness (generalized) (M62.81);Difficulty in walking, not elsewhere classified (R26.2)    Time: 1451-1520 PT Time Calculation (min) (ACUTE ONLY): 29 min   Charges:   PT Evaluation $PT Eval Moderate Complexity: 1 Mod PT Treatments $Therapeutic Activity: 8-22 mins        Zelia Yzaguirre H. Owens Shark, PT, DPT, NCS 03/03/19, 3:51 PM [phone]

## 2019-03-04 ENCOUNTER — Inpatient Hospital Stay: Payer: Medicare Other

## 2019-03-04 DIAGNOSIS — T451X5A Adverse effect of antineoplastic and immunosuppressive drugs, initial encounter: Secondary | ICD-10-CM | POA: Diagnosis present

## 2019-03-04 DIAGNOSIS — D709 Neutropenia, unspecified: Secondary | ICD-10-CM | POA: Diagnosis present

## 2019-03-04 DIAGNOSIS — E876 Hypokalemia: Secondary | ICD-10-CM | POA: Diagnosis present

## 2019-03-04 DIAGNOSIS — E883 Tumor lysis syndrome: Secondary | ICD-10-CM | POA: Diagnosis present

## 2019-03-04 DIAGNOSIS — D696 Thrombocytopenia, unspecified: Secondary | ICD-10-CM | POA: Diagnosis present

## 2019-03-04 DIAGNOSIS — Z79899 Other long term (current) drug therapy: Secondary | ICD-10-CM | POA: Diagnosis not present

## 2019-03-04 DIAGNOSIS — K5900 Constipation, unspecified: Secondary | ICD-10-CM | POA: Diagnosis present

## 2019-03-04 DIAGNOSIS — N179 Acute kidney failure, unspecified: Secondary | ICD-10-CM | POA: Diagnosis present

## 2019-03-04 DIAGNOSIS — N136 Pyonephrosis: Secondary | ICD-10-CM | POA: Diagnosis present

## 2019-03-04 DIAGNOSIS — Z20828 Contact with and (suspected) exposure to other viral communicable diseases: Secondary | ICD-10-CM | POA: Diagnosis present

## 2019-03-04 DIAGNOSIS — C8293 Follicular lymphoma, unspecified, intra-abdominal lymph nodes: Secondary | ICD-10-CM | POA: Diagnosis present

## 2019-03-04 DIAGNOSIS — Z90710 Acquired absence of both cervix and uterus: Secondary | ICD-10-CM | POA: Diagnosis not present

## 2019-03-04 DIAGNOSIS — I10 Essential (primary) hypertension: Secondary | ICD-10-CM | POA: Diagnosis present

## 2019-03-04 DIAGNOSIS — N170 Acute kidney failure with tubular necrosis: Secondary | ICD-10-CM | POA: Diagnosis present

## 2019-03-04 LAB — CBC
HCT: 34 % — ABNORMAL LOW (ref 36.0–46.0)
Hemoglobin: 10.8 g/dL — ABNORMAL LOW (ref 12.0–15.0)
MCH: 26.6 pg (ref 26.0–34.0)
MCHC: 31.8 g/dL (ref 30.0–36.0)
MCV: 83.7 fL (ref 80.0–100.0)
Platelets: 48 10*3/uL — ABNORMAL LOW (ref 150–400)
RBC: 4.06 MIL/uL (ref 3.87–5.11)
RDW: 15.3 % (ref 11.5–15.5)
WBC: 7.8 10*3/uL (ref 4.0–10.5)
nRBC: 0 % (ref 0.0–0.2)

## 2019-03-04 LAB — BASIC METABOLIC PANEL
Anion gap: 11 (ref 5–15)
BUN: 22 mg/dL (ref 8–23)
CO2: 28 mmol/L (ref 22–32)
Calcium: 8.2 mg/dL — ABNORMAL LOW (ref 8.9–10.3)
Chloride: 102 mmol/L (ref 98–111)
Creatinine, Ser: 1.69 mg/dL — ABNORMAL HIGH (ref 0.44–1.00)
GFR calc Af Amer: 33 mL/min — ABNORMAL LOW (ref >60)
GFR calc non Af Amer: 29 mL/min — ABNORMAL LOW (ref >60)
Glucose, Bld: 98 mg/dL (ref 70–99)
Potassium: 2.6 mmol/L — CL (ref 3.5–5.1)
Sodium: 141 mmol/L (ref 135–145)

## 2019-03-04 LAB — MAGNESIUM: Magnesium: 1.5 mg/dL — ABNORMAL LOW (ref 1.7–2.4)

## 2019-03-04 MED ORDER — POTASSIUM CHLORIDE CRYS ER 20 MEQ PO TBCR
20.0000 meq | EXTENDED_RELEASE_TABLET | ORAL | Status: DC
  Administered 2019-03-04: 07:00:00 20 meq via ORAL
  Filled 2019-03-04: qty 1

## 2019-03-04 MED ORDER — ONDANSETRON HCL 4 MG/2ML IJ SOLN
4.0000 mg | Freq: Four times a day (QID) | INTRAMUSCULAR | Status: DC | PRN
  Administered 2019-03-04 (×3): 4 mg via INTRAVENOUS
  Filled 2019-03-04 (×3): qty 2

## 2019-03-04 MED ORDER — ENSURE ENLIVE PO LIQD
237.0000 mL | Freq: Two times a day (BID) | ORAL | Status: DC
  Administered 2019-03-05 (×2): 237 mL via ORAL

## 2019-03-04 MED ORDER — MAGNESIUM SULFATE 2 GM/50ML IV SOLN
2.0000 g | Freq: Once | INTRAVENOUS | Status: AC
  Administered 2019-03-04: 2 g via INTRAVENOUS
  Filled 2019-03-04: qty 50

## 2019-03-04 MED ORDER — POTASSIUM CHLORIDE IN NACL 40-0.9 MEQ/L-% IV SOLN
INTRAVENOUS | Status: DC
  Administered 2019-03-04: 08:00:00 100 mL/h via INTRAVENOUS
  Filled 2019-03-04: qty 1000

## 2019-03-04 MED ORDER — BISACODYL 10 MG RE SUPP
10.0000 mg | Freq: Once | RECTAL | Status: AC
  Administered 2019-03-04: 13:00:00 10 mg via RECTAL
  Filled 2019-03-04: qty 1

## 2019-03-04 MED ORDER — PROMETHAZINE HCL 25 MG/ML IJ SOLN
12.5000 mg | Freq: Three times a day (TID) | INTRAMUSCULAR | Status: DC | PRN
  Administered 2019-03-04: 12.5 mg via INTRAVENOUS
  Filled 2019-03-04: qty 1

## 2019-03-04 MED ORDER — ADULT MULTIVITAMIN W/MINERALS CH
1.0000 | ORAL_TABLET | Freq: Every day | ORAL | Status: DC
  Administered 2019-03-05 – 2019-03-06 (×2): 1 via ORAL
  Filled 2019-03-04 (×2): qty 1

## 2019-03-04 MED ORDER — POTASSIUM CHLORIDE IN NACL 40-0.9 MEQ/L-% IV SOLN
INTRAVENOUS | Status: AC
  Administered 2019-03-04 – 2019-03-05 (×2): 100 mL/h via INTRAVENOUS
  Filled 2019-03-04 (×3): qty 1000

## 2019-03-04 MED ORDER — POTASSIUM CHLORIDE CRYS ER 20 MEQ PO TBCR
40.0000 meq | EXTENDED_RELEASE_TABLET | Freq: Once | ORAL | Status: AC
  Administered 2019-03-04: 40 meq via ORAL
  Filled 2019-03-04: qty 2

## 2019-03-04 NOTE — Progress Notes (Signed)
[name]    MR#:  [id number]  DATE OF BIRTH:  Jul 28, 1940  SUBJECTIVE:  CHIEF COMPLAINT:   Chief Complaint  Patient presents with  . Constipation   -continues to feel tired and weak -Received her first cycle of R-CHOP on 02/22/2019.  REVIEW OF SYSTEMS:  Review of Systems  Constitutional: Positive for malaise/fatigue. Negative for chills and fever.  Eyes: Negative for blurred vision and double vision.  Respiratory: Negative for cough, shortness of breath and wheezing.   Cardiovascular: Negative for chest pain and palpitations.  Gastrointestinal: Positive for constipation. Negative for abdominal pain, diarrhea, nausea and vomiting.  Genitourinary: Negative for dysuria.  Musculoskeletal: Positive for back pain. Negative for myalgias.  Neurological: Positive for weakness. Negative for dizziness, speech change, focal weakness, seizures and headaches.  Psychiatric/Behavioral: Negative for depression.    DRUG ALLERGIES:  No Known Allergies  VITALS:  Blood pressure 116/79, pulse 64, temperature 97.9 F (36.6 C), temperature source Oral, resp. rate 18, height 5\' 7"  (1.702 m), weight 68.3 kg, SpO2 98 %.  PHYSICAL EXAMINATION:  Physical Exam  GENERAL:  78 y.o.-year-old patient lying in the bed with no acute distress.  EYES: Pupils equal, round, reactive to light and accommodation. No scleral icterus. Extraocular muscles intact.  HEENT: Head atraumatic, normocephalic. Oropharynx and nasopharynx clear.  NECK:  Supple, no jugular venous distention. No thyroid enlargement, no tenderness.  LUNGS: Normal breath sounds bilaterally, no wheezing, rales,rhonchi or crepitation. No use of accessory muscles of respiration.  Decreased bibasilar breath sounds CARDIOVASCULAR: S1, S2 normal. No murmurs, rubs, or gallops.  ABDOMEN: Soft, nontender, nondistended. Bowel sounds present. No organomegaly or mass.  EXTREMITIES: No  pedal edema, cyanosis, or clubbing.  NEUROLOGIC: Cranial nerves II through XII are intact. Muscle strength 5/5 in all extremities. Sensation intact. Gait not checked.  PSYCHIATRIC: The patient is alert and oriented x 3.  SKIN: No obvious rash, lesion, or ulcer.    LABORATORY PANEL:   CBC Recent Labs  Lab 03/04/19 0534  WBC 7.8  HGB 10.8*  HCT 34.0*  PLT 48*   ------------------------------------------------------------------------------------------------------------------  Chemistries  Recent Labs  Lab 03/02/19 0934 03/04/19 0534  NA 139 141  K 3.4* 2.6*  CL 97* 102  CO2 34* 28  GLUCOSE 143* 98  BUN 40* 22  CREATININE 2.07* 1.69*  CALCIUM 8.9 8.2*  MG  --  1.5*  AST 22  --   ALT 48*  --   ALKPHOS 61  --   BILITOT 1.6*  --    ------------------------------------------------------------------------------------------------------------------  Cardiac Enzymes No results for input(s): TROPONINI in the last 168 hours. ------------------------------------------------------------------------------------------------------------------  RADIOLOGY:  No results found.  EKG:  No orders found for this or any previous visit.  ASSESSMENT AND PLAN:   78 year old female with past medical history significant for hypertension and recent diagnosis of follicular lymphoma 2 weeks ago presents to hospital secondary to generalized weakness and constipation.  1.  Generalized weakness-likely secondary to chemotherapy.  Also patient has UTI. -Physical therapy consulted- home health at discharge  2.  Acute cystitis-urine cultures have been ordered.  Patient currently on Rocephin.  Stop after 3 days  3.  Severe neutropenia-patient has received chemotherapy on 02/22/2019 with R-CHOP, she had a  Neulasta shot on 02/25/2019.  Continue to monitor WBCs.  - No intervention at this time Monitor for any fevers. - Improved now  4.  Thrombocytopenia-acute again, likely secondary to recent  chemotherapy.  Continue to monitor.  No transfusion unless platelets are less than 10 K or symptomatic with bleed  5.  Acute renal failure-renal function was normal at [facility] 1 week ago.   -CT of the abdomen showing retroperitoneal lymphadenopathy, however no complete obstruction noted.  Has mild hydroureters. -Poor oral intake, likely prerenal causes.  IV fluids and monitor - slow improvement noted  6.  Constipation-medications have been added  7.  Follicular lymphoma-diagnosed 2 weeks ago after an axillary lymph node biopsy on 02/19/2019, Port-A-Cath placed on 02/22/2019 and started on first cycle chemotherapy with R-CHOP on 02/22/2019.  Received Neulasta on 02/25/2019.  Continue outpatient follow-up.  Second cycle of R-CHOP scheduled for 03/16/2019.  8.  DVT prophylaxis-teds and SCDs only given thrombocytopenia  9. Hypokalemia- being replaced  Daughter updated at bedside   All the records are reviewed and case discussed with Care Management/Social Workerr. Management plans discussed with the patient, family and they are in agreement.  CODE STATUS: Full code  TOTAL TIME TAKING CARE OF THIS PATIENT: 37 minutes.   POSSIBLE D/C IN 1-2 DAYS, DEPENDING ON CLINICAL CONDITION.   Gladstone Lighter M.D on 03/04/2019 at 11:12 AM  Between 7am to 6pm - Pager - [phone]  After 6pm go to [email or web address] - password [email or web address] [facility]  Office  [phone]  CC: Primary care physician; Patient, No Pcp Per

## 2019-03-04 NOTE — Progress Notes (Signed)
Pt vomited 87ml of clear emesis. Still nauseated. Pt does not eat. No appetite per pt. Lactulose given in am. Supp given couple of hrs ago. No BM yet. Pt c/o abdominal pain, RLQ. Currently in the bathroom. Pt requested nausea and pain med. Dr Tressia Miners notified of above and to place orders.

## 2019-03-04 NOTE — Plan of Care (Addendum)
Pt had small BM. Continue c/o nausea and RLQ pain. Vomited several times clear emesis. Phenergan given with no change. Dr Tressia Miners made aware. Diet changed to clear liquid.

## 2019-03-04 NOTE — Progress Notes (Signed)
Initial Nutrition Assessment  DOCUMENTATION CODES:   Not applicable  INTERVENTION:   Ensure Enlive po BID, each supplement provides 350 kcal and 20 grams of protein  MVI daily   Pt likely at moderate refeed risk; recommend monitor K, Mg and P labs daily   NUTRITION DIAGNOSIS:   Increased nutrient needs related to cancer and cancer related treatments as evidenced by increased estimated needs  GOAL:   Patient will meet greater than or equal to 90% of their needs  MONITOR:   PO intake, Supplement acceptance, Labs, Weight trends, Skin, I & O's  REASON FOR ASSESSMENT:   Malnutrition Screening Tool    ASSESSMENT:   78 y.o. female with past medical history of lymphoma started chemo 8/13 and hypertension who presents to the ED complaining of constipation and vomiting.  RD working remotely.  Pt reports poor appetite and oral intake for 1 week pta. Pt has continued to have poor appetite and oral intake in hospital. RD will add supplements and MVI to help pt meet her estimated needs. There is no weight history in chart to determine if any significant weight loss. Pt likely at moderate refeed risk; recommend monitor K, Mg and P labs daily.    Medications reviewed and include: miralax, senokot, NaCl w/ KCl @100ml /hr, ceftriaxone   Labs reviewed:  K 2.6(L), Mg 1.5(L)  Unable to complete Nutrition-Focused physical exam at this time.   Diet Order:   Diet Order            Diet regular Room service appropriate? Yes; Fluid consistency: Thin  Diet effective now             EDUCATION NEEDS:   Not appropriate for education at this time  Skin:  Skin Assessment: Reviewed RN Assessment(incision neck, ecchymosis)  Last BM:  constipation  Height:   Ht Readings from Last 1 Encounters:  03/02/19 5\' 7"  (1.702 m)    Weight:   Wt Readings from Last 1 Encounters:  03/02/19 68.3 kg    Ideal Body Weight:  61.36 kg  BMI:  Body mass index is 23.58 kg/m.  Estimated  Nutritional Needs:   Kcal:  1600-1800kcal/day  Protein:  80-90g/day  Fluid:  >1.5L/day  Koleen Distance MS, RD, LDN Pager #- [phone] Office#- [phone] After Hours Pager: [phone]

## 2019-03-05 LAB — BASIC METABOLIC PANEL
Anion gap: 9 (ref 5–15)
BUN: 16 mg/dL (ref 8–23)
CO2: 27 mmol/L (ref 22–32)
Calcium: 8.3 mg/dL — ABNORMAL LOW (ref 8.9–10.3)
Chloride: 108 mmol/L (ref 98–111)
Creatinine, Ser: 1.53 mg/dL — ABNORMAL HIGH (ref 0.44–1.00)
GFR calc Af Amer: 38 mL/min — ABNORMAL LOW (ref >60)
GFR calc non Af Amer: 32 mL/min — ABNORMAL LOW (ref >60)
Glucose, Bld: 122 mg/dL — ABNORMAL HIGH (ref 70–99)
Potassium: 3.4 mmol/L — ABNORMAL LOW (ref 3.5–5.1)
Sodium: 144 mmol/L (ref 135–145)

## 2019-03-05 MED ORDER — METOCLOPRAMIDE HCL 5 MG/ML IJ SOLN
5.0000 mg | Freq: Four times a day (QID) | INTRAMUSCULAR | Status: DC
  Administered 2019-03-05 – 2019-03-06 (×5): 5 mg via INTRAVENOUS
  Filled 2019-03-05 (×5): qty 2

## 2019-03-05 NOTE — Plan of Care (Signed)
  Problem: Education: Goal: Knowledge of General Education information will improve Description: Including pain rating scale, medication(s)/side effects and non-pharmacologic comfort measures Outcome: Progressing   Problem: Clinical Measurements: Goal: Ability to maintain clinical measurements within normal limits will improve Outcome: Progressing Goal: Will remain free from infection Outcome: Progressing   Problem: Activity: Goal: Risk for activity intolerance will decrease Outcome: Progressing   Problem: Activity: Goal: Risk for activity intolerance will decrease Outcome: Progressing   Problem: Nutrition: Goal: Adequate nutrition will be maintained Outcome: Progressing   Problem: Safety: Goal: Ability to remain free from injury will improve Outcome: Progressing

## 2019-03-05 NOTE — TOC Progression Note (Signed)
Transition of Care [facility]) - Progression Note    Patient Details  Name: Christie Farmer MRN: [id number] Date of Birth: 01-13-1941  Transition of [facility]) CM/SW Contact  Barrett Goldie, Lenice Llamas Phone Number: [phone]  03/05/2019, 4:27 PM  Clinical Narrative: Clinical Social Worker ([place]) met with patient and her daughter Christie Farmer [phone] was at bedside. Per daughter patient has been staying with her in [place]. Per daughter patient is open to home health however she can't remember the name of the agency. Per [facility] home health agency they are seeing patient at home and will continue to do so. Daughter is aware that patient is open to [facility] and wants to continue services with them. Per daughter patient needs a rolling walker. [employer] agency representative is aware of above. CSW will continue to follow and assist as needed.     Expected Discharge Plan: Home/Self Care    Expected Discharge Plan and Services Expected Discharge Plan: Home/Self Care In-house Referral: NA Discharge Planning Services: NA   Living arrangements for the past 2 months: Single Family Home Expected Discharge Date: 03/03/19                                     Social Determinants of Health (SDOH) Interventions    Readmission Risk Interventions No flowsheet data found.

## 2019-03-05 NOTE — Progress Notes (Signed)
Physical Therapy Treatment Patient Details Name: Christie Farmer MRN: [id number] DOB: [birth year] Today's Date: 03/05/2019    History of Present Illness presented to ER secondary to constipation, possible bowel obstruction; admitted for management of AKI, UTI.  CT abdomen negative for obstruction; significant for extensive adenopathy related to malignancy/metastatic disease.  Of note, recent diagnosis of follicular lymphoma with initial dose of chemo (8/16) via R chest port; next dose scheduled for 9/4.    PT Comments    Pt agreeable to PT; denies pain. Reports mild intermittent N/V with episode of emesis this morning. Pt has not attempted lunch at this time. Demonstrates independent bed mobility and STS with rolling walker with min guard supervision. Pt has received rolling walker for home use; adjusted for proper height and education to pt/daughter regarding proper height and adjustment. Pt ambulates 200 ft with rolling walker and to/from bathroom without assistive device with Min guard. Set up/supervision for bathroom use and hygiene. Pt performs STS transfers without assistive device, but use of rail and Min guard. Pt/daughter educated on supine/seated exercises with written handout provided. Continue PT to progress strength, endurance to improve functional mobility; pt will need stair training.   Follow Up Recommendations  Home health PT     Equipment Recommendations       Recommendations for Other Services       Precautions / Restrictions Precautions Precautions: Fall Restrictions Weight Bearing Restrictions: No    Mobility  Bed Mobility Overal bed mobility: Independent             General bed mobility comments: No difficulties noted  Transfers Overall transfer level: Modified independent Equipment used: Rolling walker (2 wheeled) Transfers: Sit to/from Stand Sit to Stand: Modified independent (Device/Increase time)         General transfer comment: STS to/from bed  with rw; also to/from commode without AD, but use of rail  Ambulation/Gait Ambulation/Gait assistance: Min guard Gait Distance (Feet): 200 Feet Assistive device: Rolling walker (2 wheeled)     Gait velocity interpretation: <1.8 ft/sec, indicate of risk for recurrent falls General Gait Details: reciprocal pattern, narrow BOS and mild decreased stride (seems to be baseline); no LOB. Mild slow with turns.    Stairs             Wheelchair Mobility    Modified Rankin (Stroke Patients Only)       Balance Overall balance assessment: Modified Independent                                          Cognition Arousal/Alertness: Awake/alert Behavior During Therapy: WFL for tasks assessed/performed Overall Cognitive Status: Within Functional Limits for tasks assessed                                        Exercises General Exercises - Lower Extremity Ankle Circles/Pumps: AROM;Both;10 reps Quad Sets: Strengthening;Both;10 reps Gluteal Sets: Strengthening;Both;10 reps Short Arc Quad: Other (comment)(reviewed with handout) Long Arc Quad: AROM;Both;10 reps Heel Slides: AROM;Both;10 reps Hip ABduction/ADduction: AROM;Both;10 reps Straight Leg Raises: AAROM;Both;10 reps Other Exercises Other Exercises: provided written hand out for exercises Other Exercises: assisted with use of bathroom and hand hygiene setup/supervision    General Comments        Pertinent Vitals/Pain Pain Assessment: No/denies pain    Home Living  Prior Function            PT Goals (current goals can now be found in the care plan section) Progress towards PT goals: Progressing toward goals    Frequency    Min 2X/week      PT Plan Current plan remains appropriate    Co-evaluation              AM-PAC PT "6 Clicks" Mobility   Outcome Measure  Help needed turning from your back to your side while in a flat bed without  using bedrails?: None Help needed moving from lying on your back to sitting on the side of a flat bed without using bedrails?: None Help needed moving to and from a bed to a chair (including a wheelchair)?: None Help needed standing up from a chair using your arms (e.g., wheelchair or bedside chair)?: A Little Help needed to walk in hospital room?: A Little Help needed climbing 3-5 steps with a railing? : A Little 6 Click Score: 21    End of Session Equipment Utilized During Treatment: Gait belt Activity Tolerance: Patient tolerated treatment well Patient left: in bed;with call bell/phone within reach;with bed alarm set;with family/visitor present   PT Visit Diagnosis: Muscle weakness (generalized) (M62.81);Difficulty in walking, not elsewhere classified (R26.2)     Time: [id number] PT Time Calculation (min) (ACUTE ONLY): 41 min  Charges:  $Gait Training: 8-22 mins $Therapeutic Exercise: 8-22 mins $Therapeutic Activity: 8-22 mins                      Larae Grooms, PTA 03/05/2019, 1:16 PM

## 2019-03-05 NOTE — Progress Notes (Signed)
[place] [facility] [place] NAME: Christie Farmer    MR#:  [id number]  DATE OF BIRTH:  03-Jul-1941  SUBJECTIVE:  CHIEF COMPLAINT:   Chief Complaint  Patient presents with  . Constipation   -Significant nausea vomiting and abdominal pain yesterday.  Feels much better today.  Still has nausea -Very small BM yesterday  REVIEW OF SYSTEMS:  Review of Systems  Constitutional: Positive for malaise/fatigue. Negative for chills and fever.  Eyes: Negative for blurred vision and double vision.  Respiratory: Negative for cough, shortness of breath and wheezing.   Cardiovascular: Negative for chest pain and palpitations.  Gastrointestinal: Positive for abdominal pain, constipation, nausea and vomiting. Negative for diarrhea.  Genitourinary: Negative for dysuria.  Musculoskeletal: Positive for back pain. Negative for myalgias.  Neurological: Positive for weakness. Negative for dizziness, speech change, focal weakness, seizures and headaches.  Psychiatric/Behavioral: Negative for depression.    DRUG ALLERGIES:  No Known Allergies  VITALS:  Blood pressure (!) 148/95, pulse 81, temperature 97.6 F (36.4 C), resp. rate 20, height 5\' 7"  (1.702 m), weight 68.3 kg, SpO2 100 %.  PHYSICAL EXAMINATION:  Physical Exam  GENERAL:  78 y.o.-year-old patient lying in the bed with no acute distress.  EYES: Pupils equal, round, reactive to light and accommodation. No scleral icterus. Extraocular muscles intact.  HEENT: Head atraumatic, normocephalic. Oropharynx and nasopharynx clear.  NECK:  Supple, no jugular venous distention. No thyroid enlargement, no tenderness.  LUNGS: Normal breath sounds bilaterally, no wheezing, rales,rhonchi or crepitation. No use of accessory muscles of respiration.  Decreased bibasilar breath sounds CARDIOVASCULAR: S1, S2 normal. No murmurs, rubs, or gallops.  ABDOMEN: Soft, nontender, nondistended.  Hypoactive bowel sounds present. No  organomegaly or mass.  EXTREMITIES: No pedal edema, cyanosis, or clubbing.  NEUROLOGIC: Cranial nerves II through XII are intact. Muscle strength 5/5 in all extremities. Sensation intact. Gait not checked.  PSYCHIATRIC: The patient is alert and oriented x 3.  SKIN: No obvious rash, lesion, or ulcer.    LABORATORY PANEL:   CBC Recent Labs  Lab 03/04/19 0534  WBC 7.8  HGB 10.8*  HCT 34.0*  PLT 48*   ------------------------------------------------------------------------------------------------------------------  Chemistries  Recent Labs  Lab 03/02/19 0934 03/04/19 0534  NA 139 141  K 3.4* 2.6*  CL 97* 102  CO2 34* 28  GLUCOSE 143* 98  BUN 40* 22  CREATININE 2.07* 1.69*  CALCIUM 8.9 8.2*  MG  --  1.5*  AST 22  --   ALT 48*  --   ALKPHOS 61  --   BILITOT 1.6*  --    ------------------------------------------------------------------------------------------------------------------  Cardiac Enzymes No results for input(s): TROPONINI in the last 168 hours. ------------------------------------------------------------------------------------------------------------------  RADIOLOGY:  Dg Abd 1 View  Result Date: 03/04/2019 CLINICAL DATA:  Ileus, vomiting EXAM: ABDOMEN - 1 VIEW COMPARISON:  CT 03/02/2019 FINDINGS: Mild gaseous distention of the right colon and transverse colon. No significant gaseous distention of small bowel loops. No organomegaly or free air. IMPRESSION: Mild gaseous distention of the right colon and transverse colon may reflect mild ileus. Electronically Signed   By: Rolm Baptise M.D.   On: 03/04/2019 16:41    EKG:  No orders found for this or any previous visit.  ASSESSMENT AND PLAN:   78 year old female with past medical history significant for hypertension and recent diagnosis of follicular lymphoma 2 weeks ago presents to hospital secondary to generalized weakness and constipation.  1.  Generalized weakness-likely secondary to chemotherapy.  Also  patient has UTI. -Physical therapy consulted- home health at discharge  2.  Acute cystitis-urine cultures growing gram-negative rods.  Patient currently on Rocephin.  Stop after 3 days  3.  Severe neutropenia-patient has received chemotherapy on 02/22/2019 with R-CHOP, she had a  Neulasta shot on 02/25/2019.  Continue to monitor WBCs.  - No intervention at this time Monitor for any fevers. - Improved now  4.  Thrombocytopenia-acute again, likely secondary to recent chemotherapy.  Continue to monitor.  No transfusion unless platelets are less than 10 K or symptomatic with bleed  5.  Acute renal failure-renal function was normal at [facility] 1 week ago.   -CT of the abdomen showing retroperitoneal lymphadenopathy, however no complete obstruction noted.  Has mild hydroureters. -Poor oral intake, likely prerenal causes.  IV fluids and monitor - slow improvement noted  6.  Constipation-medications have been added KUB with mild ileus only -Nausea and vomiting-better today.  If continues to get worse, consider NG tube.  Currently on clear liquids for  7.  Follicular lymphoma-diagnosed 2 weeks ago after an axillary lymph node biopsy on 02/19/2019, Port-A-Cath placed on 02/22/2019 and started on first cycle chemotherapy with R-CHOP on 02/22/2019.  Received Neulasta on 02/25/2019.  Continue outpatient follow-up.  Second cycle of R-CHOP scheduled for 03/16/2019.  8.  DVT prophylaxis-teds and SCDs only given thrombocytopenia  9. Hypokalemia-  Replaced, f/u today      All the records are reviewed and case discussed with Care Management/Social Workerr. Management plans discussed with the patient, family and they are in agreement.  CODE STATUS: Full code  TOTAL TIME TAKING CARE OF THIS PATIENT: 39 minutes.   POSSIBLE D/C IN 1-2 DAYS, DEPENDING ON CLINICAL CONDITION.   Gladstone Lighter M.D on 03/05/2019 at 10:25 AM  Between 7am to 6pm - Pager - [phone]  After 6pm go to [email or web address] - password  [facility]  Office  [phone]  CC: Primary care physician; Patient, No Pcp Per

## 2019-03-05 NOTE — Progress Notes (Signed)
Pt better today. Eating a lillle better no nausea  reglan statred. lbe stool today this pm.

## 2019-03-06 LAB — BASIC METABOLIC PANEL
Anion gap: 13 (ref 5–15)
BUN: 13 mg/dL (ref 8–23)
CO2: 29 mmol/L (ref 22–32)
Calcium: 8.6 mg/dL — ABNORMAL LOW (ref 8.9–10.3)
Chloride: 103 mmol/L (ref 98–111)
Creatinine, Ser: 1.74 mg/dL — ABNORMAL HIGH (ref 0.44–1.00)
GFR calc Af Amer: 32 mL/min — ABNORMAL LOW (ref >60)
GFR calc non Af Amer: 28 mL/min — ABNORMAL LOW (ref >60)
Glucose, Bld: 91 mg/dL (ref 70–99)
Potassium: 3 mmol/L — ABNORMAL LOW (ref 3.5–5.1)
Sodium: 145 mmol/L (ref 135–145)

## 2019-03-06 LAB — CBC
HCT: 34.5 % — ABNORMAL LOW (ref 36.0–46.0)
Hemoglobin: 10.7 g/dL — ABNORMAL LOW (ref 12.0–15.0)
MCH: 27.4 pg (ref 26.0–34.0)
MCHC: 31 g/dL (ref 30.0–36.0)
MCV: 88.5 fL (ref 80.0–100.0)
Platelets: 94 10*3/uL — ABNORMAL LOW (ref 150–400)
RBC: 3.9 MIL/uL (ref 3.87–5.11)
RDW: 15.6 % — ABNORMAL HIGH (ref 11.5–15.5)
WBC: 14.7 10*3/uL — ABNORMAL HIGH (ref 4.0–10.5)
nRBC: 0 % (ref 0.0–0.2)

## 2019-03-06 LAB — URINE CULTURE: Culture: 60000 — AB

## 2019-03-06 MED ORDER — POLYETHYLENE GLYCOL 3350 17 G PO PACK: 17.0000 g | PACK | Freq: Every day | ORAL | 0 refills | Status: AC

## 2019-03-06 MED ORDER — HEPARIN SOD (PORK) LOCK FLUSH 100 UNIT/ML IV SOLN
INTRAVENOUS | Status: AC
  Filled 2019-03-06: qty 5

## 2019-03-06 MED ORDER — SENNA 8.6 MG PO TABS: 1.0000 | ORAL_TABLET | Freq: Every day | ORAL | 0 refills | Status: AC

## 2019-03-06 MED ORDER — ENSURE ENLIVE PO LIQD: 237.0000 mL | Freq: Two times a day (BID) | ORAL | 12 refills | Status: AC

## 2019-03-06 MED ORDER — POTASSIUM CHLORIDE CRYS ER 20 MEQ PO TBCR
40.0000 meq | EXTENDED_RELEASE_TABLET | ORAL | Status: AC
  Administered 2019-03-06 (×2): 40 meq via ORAL
  Filled 2019-03-06 (×2): qty 4

## 2019-03-06 MED ORDER — HEPARIN SOD (PORK) LOCK FLUSH 100 UNIT/ML IV SOLN: 500.0000 [IU] | Freq: Once | INTRAVENOUS | Status: DC

## 2019-03-06 MED ORDER — METOCLOPRAMIDE HCL 5 MG PO TABS: 5.0000 mg | ORAL_TABLET | Freq: Three times a day (TID) | ORAL | 1 refills | Status: DC

## 2019-03-06 NOTE — Plan of Care (Signed)
  Problem: Education: Goal: Knowledge of General Education information will improve Description: Including pain rating scale, medication(s)/side effects and non-pharmacologic comfort measures Outcome: Progressing   Problem: Clinical Measurements: Goal: Ability to maintain clinical measurements within normal limits will improve Outcome: Progressing Goal: Will remain free from infection Outcome: Progressing   Problem: Activity: Goal: Risk for activity intolerance will decrease Outcome: Progressing   Problem: Nutrition: Goal: Adequate nutrition will be maintained Outcome: Progressing   Problem: Elimination: Goal: Will not experience complications related to bowel motility Outcome: Progressing   Problem: Safety: Goal: Ability to remain free from injury will improve Outcome: Progressing   Problem: Elimination: Goal: Will not experience complications related to bowel motility Outcome: Progressing

## 2019-03-06 NOTE — Plan of Care (Signed)
Pt reports no problems. Able to verbalize understanding of care.

## 2019-03-06 NOTE — Progress Notes (Signed)
Discharge instructions discussed with pt and dtr. Diet / meds / activity and f/u discussed.  Port flushed and deaccessed  Per  Protocol. Verbalized understanding. Ready for discharge

## 2019-03-06 NOTE — Discharge Summary (Signed)
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 Cancer ([place])   . Hypertension     HOSPITAL COURSE:   78 year old female with past medical history significant for hypertension and recent diagnosis of follicular lymphoma 2 weeks ago presents to hospital secondary to generalized weakness and constipation.  1.  Generalized weakness-likely secondary to chemotherapy.  Nausea vomiting and poor oral intake. -Improved with fluids in the hospital. -Physical therapy consulted- home health at discharge  2.  Acute cystitis-urine cultures grew less than 60,000 colonies of Acinetobacter and Pseudomonas.  However patient was not symptomatic.  She received 3 days of Rocephin in the hospital..  It does not cover for the above bacteria.  But since she has not been symptomatic, decided not to pursue any further antibiotics at this time.  Monitor   3.  Severe neutropenia-patient has received chemotherapy on 02/22/2019 with R-CHOP, she had a  Neulasta shot on 02/25/2019.  Continue to monitor WBCs.  - No intervention at this time -No fevers.  Elevated WBC at the time of discharge  4.  Thrombocytopenia-acute again, likely secondary to recent chemotherapy.  Continue to monitor.  No transfusion unless platelets are less than 10 K or symptomatic with bleed  5.  Acute renal failure-renal function was normal  at [facility] 1 week ago.   -CT of the abdomen showing retroperitoneal lymphadenopathy, however no complete obstruction noted.  Has mild hydroureters. -Poor oral intake, likely prerenal causes and ATN -Received IV fluids, normalized BUN but creatinine still at 1.7.  Outpatient follow-up recommended.  6.  Constipation-medications have been added KUB with mild ileus only -Patient had a few bowel movements here in the hospital with much improvement in her nausea and vomiting  7.  Follicular lymphoma-diagnosed 2 weeks ago after an axillary lymph node biopsy on 02/19/2019, Port-A-Cath placed on 02/22/2019 and started on first cycle chemotherapy with R-CHOP on 02/22/2019.  Received Neulasta on 02/25/2019.  Continue outpatient follow-up.  Second cycle of R-CHOP scheduled for 03/16/2019.  8.  Electrolyte abnormalities with hypokalemia and hypomagnesemia-being replaced    Patient is more stronger, up and ambulatory.  Will be discharged home today   DISCHARGE CONDITIONS:   Guarded  CONSULTS OBTAINED:   None  DRUG ALLERGIES:   No Known Allergies DISCHARGE MEDICATIONS:   Allergies as of 03/06/2019   No Known Allergies     Medication List    TAKE these medications   amLODipine 10 MG tablet Commonly known as: NORVASC Take 10 mg by mouth daily.   feeding supplement (ENSURE ENLIVE) Liqd Take 237 mLs by mouth 2 (two) times daily between meals.   loratadine 10 MG tablet Commonly known as: CLARITIN Take 10 mg by mouth daily.   metoCLOPramide 5 MG tablet Commonly known as: Reglan Take 1 tablet (5 mg total) by mouth 3 (three) times daily before meals.   polyethylene glycol 17 g packet Commonly known  as: MIRALAX / GLYCOLAX Take 17 g by mouth daily. Start taking on: March 07, 2019   senna 8.6 MG Tabs tablet Commonly known as: SENOKOT Take 1 tablet (8.6 mg total) by mouth at bedtime.            Durable Medical Equipment  (From admission, onward)         Start     Ordered    03/04/19 0847  For home use only DME Walker rolling  Once    Question:  Patient needs a walker to treat with the following condition  Answer:  Weakness   03/04/19 0846           DISCHARGE INSTRUCTIONS:   1. PCP f/u in 1-2 weeks 2. Oncology f/u as prior scheduled  DIET:   Cardiac diet  ACTIVITY:   Activity as tolerated  OXYGEN:   Home Oxygen: No.  Oxygen Delivery: room air  DISCHARGE LOCATION:   home   If you experience worsening of your admission symptoms, develop shortness of breath, life threatening emergency, suicidal or homicidal thoughts you must seek medical attention immediately by calling 911 or calling your MD immediately  if symptoms less severe.  You Must read complete instructions/literature along with all the possible adverse reactions/side effects for all the Medicines you take and that have been prescribed to you. Take any new Medicines after you have completely understood and accpet all the possible adverse reactions/side effects.   Please note  You were cared for by a hospitalist during your hospital stay. If you have any questions about your discharge medications or the care you received while you were in the hospital after you are discharged, you can call the unit and asked to speak with the hospitalist on call if the hospitalist that took care of you is not available. Once you are discharged, your primary care physician will handle any further medical issues. Please note that NO REFILLS for any discharge medications will be authorized once you are discharged, as it is imperative that you return to your primary care physician (or establish a relationship with a primary care physician if you do not have one) for your aftercare needs so that they can reassess your need for medications and monitor your lab values.    On the day of Discharge:  VITAL SIGNS:   Blood pressure 140/68, pulse (!) 56, temperature 97.7 F (36.5 C), temperature source Oral, resp.  rate 20, height 5\' 7"  (1.702 m), weight 68.3 kg, SpO2 100 %.  PHYSICAL EXAMINATION:   GENERAL:  78 y.o.-year-old patient lying in the bed with no acute distress.  EYES: Pupils equal, round, reactive to light and accommodation. No scleral icterus. Extraocular muscles intact.  HEENT: Head atraumatic, normocephalic. Oropharynx and nasopharynx clear.  NECK:  Supple, no jugular venous distention. No thyroid enlargement, no tenderness.  LUNGS: Normal breath sounds bilaterally, no wheezing, rales,rhonchi or crepitation. No use of accessory muscles of respiration.  Decreased bibasilar breath sounds CARDIOVASCULAR: S1, S2 normal. No murmurs, rubs, or gallops.  ABDOMEN: Soft, nontender, nondistended.  normal bowel sounds present. No organomegaly or mass.  EXTREMITIES: No pedal edema, cyanosis, or clubbing.  NEUROLOGIC: Cranial nerves II through XII are intact. Muscle strength 5/5 in all extremities. Sensation intact. Gait not checked.  PSYCHIATRIC: The patient is alert and oriented x 3.  SKIN: No obvious rash, lesion, or ulcer.   DATA REVIEW:   CBC Recent Labs  Lab 03/06/19 0501  WBC 14.7*  HGB 10.7*  HCT 34.5*  PLT 94*    Chemistries  Recent Labs  Lab 03/02/19 0934 03/04/19 0534  03/06/19 0551  NA 139 141   < > 145  K 3.4* 2.6*   < > 3.0*  CL 97* 102   < > 103  CO2 34* 28   < > 29  GLUCOSE 143* 98   < > 91  BUN 40* 22   < > 13  CREATININE 2.07* 1.69*   < > 1.74*  CALCIUM 8.9 8.2*   < > 8.6*  MG  --  1.5*  --   --   AST 22  --   --   --   ALT 48*  --   --   --   ALKPHOS 61  --   --   --   BILITOT 1.6*  --   --   --    < > = values in this interval not displayed.     Microbiology Results  Results for orders placed or performed during the hospital encounter of 03/02/19  SARS Coronavirus 2 [facility] order, Performed in [place] hospital lab)     Status: None   Collection Time: 03/02/19  9:34 AM  Result Value Ref Range Status   SARS Coronavirus 2 NEGATIVE NEGATIVE Final     Comment: (NOTE) If result is NEGATIVE SARS-CoV-2 target nucleic acids are NOT DETECTED. The SARS-CoV-2 RNA is generally detectable in upper and lower  respiratory specimens during the acute phase of infection. The lowest  concentration of SARS-CoV-2 viral copies this assay can detect is 250  copies / mL. A negative result does not preclude SARS-CoV-2 infection  and should not be used as the sole basis for treatment or other  patient management decisions.  A negative result may occur with  improper specimen collection / handling, submission of specimen other  than nasopharyngeal swab, presence of viral mutation(s) within the  areas targeted by this assay, and inadequate number of viral copies  (<250 copies / mL). A negative result must be combined with clinical  observations, patient history, and epidemiological information. If result is POSITIVE SARS-CoV-2 target nucleic acids are DETECTED. The SARS-CoV-2 RNA is generally detectable in upper and lower  respiratory specimens dur ing the acute phase of infection.  Positive  results are indicative of active infection with SARS-CoV-2.  Clinical  correlation with patient history and other diagnostic information is  necessary to determine patient infection status.  Positive results do  not rule out bacterial infection or co-infection with other viruses. If result is PRESUMPTIVE POSTIVE SARS-CoV-2 nucleic acids MAY BE PRESENT.   A presumptive positive result was obtained on the submitted specimen  and confirmed on repeat testing.  While 2019 novel coronavirus  (SARS-CoV-2) nucleic acids may be present in the submitted sample  additional confirmatory testing may be necessary for epidemiological  and / or clinical management purposes  to differentiate between  SARS-CoV-2 and other Sarbecovirus currently known to infect humans.  If clinically indicated additional testing with an alternate test  methodology [phone]) is advised. The  SARS-CoV-2 RNA is generally  detectable in upper and lower respiratory sp ecimens during the acute  phase of infection. The expected result is Negative. Fact Sheet for Patients:  StrictlyIdeas.no Fact Sheet for Healthcare Providers: BankingDealers.co.za This test is not yet approved or cleared by the [place] FDA and has been authorized for detection and/or diagnosis of SARS-CoV-2 by FDA under an Emergency Use Authorization (EUA).  This EUA will remain in  effect (meaning this test can be used) for the duration of the COVID-19 declaration under Section 564(b)(1) of the Act, 21 U.S.C. section 360bbb-3(b)(1), unless the authorization is terminated or revoked sooner. Performed at [facility], [address] [place], [place] [address]   Urine Culture     Status: Abnormal   Collection Time: 03/02/19  9:34 AM   Specimen: Urine, Clean Catch  Result Value Ref Range Status   Specimen Description   Final    URINE, CLEAN CATCH Performed at [facility], [address], [place], [place] [address]    Special Requests   Final    NONE Performed at [facility], [place], [place] [address]    Culture (A)  Final    60,000 COLONIES/mL ACINETOBACTER CALCOACETICUS/BAUMANNII COMPLEX 50,000 COLONIES/mL PSEUDOMONAS AERUGINOSA    Report Status 03/06/2019 FINAL  Final   Organism ID, Bacteria ACINETOBACTER CALCOACETICUS/BAUMANNII COMPLEX (A)  Final   Organism ID, Bacteria PSEUDOMONAS AERUGINOSA (A)  Final      Susceptibility   Acinetobacter calcoaceticus/baumannii complex - MIC*    CEFTAZIDIME 4 SENSITIVE Sensitive     CEFTRIAXONE 16 INTERMEDIATE Intermediate     CIPROFLOXACIN <=0.25 SENSITIVE Sensitive     GENTAMICIN <=1 SENSITIVE Sensitive     IMIPENEM <=0.25 SENSITIVE Sensitive     PIP/TAZO 8 SENSITIVE Sensitive     TRIMETH/SULFA <=20 SENSITIVE Sensitive     CEFEPIME 2 SENSITIVE Sensitive      AMPICILLIN/SULBACTAM <=2 SENSITIVE Sensitive     * 60,000 COLONIES/mL ACINETOBACTER CALCOACETICUS/BAUMANNII COMPLEX   Pseudomonas aeruginosa - MIC*    CEFTAZIDIME 4 SENSITIVE Sensitive     CIPROFLOXACIN <=0.25 SENSITIVE Sensitive     GENTAMICIN <=1 SENSITIVE Sensitive     IMIPENEM 2 SENSITIVE Sensitive     PIP/TAZO 8 SENSITIVE Sensitive     CEFEPIME <=1 SENSITIVE Sensitive     * 50,000 COLONIES/mL PSEUDOMONAS AERUGINOSA    RADIOLOGY:  No results found.   Management plans discussed with the patient, family and they are in agreement.  CODE STATUS:     Code Status Orders  (From admission, onward)         Start     Ordered   03/02/19 1207  Full code  Continuous     03/02/19 1206        Code Status History    This patient has a current code status but no historical code status.   Advance Care Planning Activity      TOTAL TIME TAKING CARE OF THIS PATIENT: 38 minutes.    Gladstone Lighter M.D on 03/06/2019 at 2:34 PM  Between 7am to 6pm - Pager - [phone]  After 6pm go to [email or web address] - Proofreader  [facility]  Office  [phone]  CC: Primary care physician; Patient, No Pcp Per   Note: This dictation was prepared with Dragon dictation along with smaller phrase technology. Any transcriptional errors that result from this process are unintentional.

## 2019-03-06 NOTE — TOC Transition Note (Signed)
Transition of Care [facility]) - CM/SW Discharge Note   Patient Details  Name: Christie Farmer MRN: [id number] Date of Birth: 1941-04-16  Transition of Care [facility]) CM/SW Contact:  Sarai January, Lenice Llamas Phone Number: [phone]  03/06/2019, 11:53 AM   Clinical Narrative: Clinical Social Worker (CSW) notified Verda Cumins home health representative that patient will D/C home today. Per Malachy Mood they will see patient tomorrow at home. Per [place] DME agency representative rolling walker has been delivered to patient's room. RN aware of above. Please reconsult if future social work needs arise. CSW signing off.       Final next level of care: [place] Barriers to Discharge: Barriers Resolved   Patient Goals and CMS Choice Patient states their goals for this hospitalization and ongoing recovery are:: To go home.   Choice offered to / list presented to : NA  Discharge Placement                       Discharge Plan and Services In-house Referral: NA Discharge Planning Services: NA            DME Arranged: Gilford Rile rolling DME Agency: [employer] Date DME Agency Contacted: 03/05/19 Time DME Agency Contacted: 1215 Representative spoke with at DME Agency: [place]: RN, PT [facility] Agency: [place] Date [place]: 03/06/19 Time [place]: 60 Representative spoke with at [place]: [place] ([place]) Interventions     Readmission Risk Interventions No flowsheet data found.

## 2019-06-15 ENCOUNTER — Emergency Department: Payer: Medicare Other

## 2019-06-15 ENCOUNTER — Inpatient Hospital Stay
Admission: EM | Admit: 2019-06-15 | Discharge: 2019-06-20 | DRG: 291 | Disposition: A | Discharge status: Skilled Nursing Facility | Payer: Medicare Other | Attending: Internal Medicine | Admitting: Internal Medicine

## 2019-06-15 ENCOUNTER — Inpatient Hospital Stay (HOSPITAL_COMMUNITY)
Admit: 2019-06-15 | Discharge: 2019-06-15 | Disposition: A | Discharge status: Home / Self Care | Payer: Medicare Other | Attending: Internal Medicine | Admitting: Internal Medicine

## 2019-06-15 ENCOUNTER — Telehealth: Payer: Self-pay

## 2019-06-15 ENCOUNTER — Other Ambulatory Visit: Payer: Self-pay

## 2019-06-15 DIAGNOSIS — I4891 Unspecified atrial fibrillation: Secondary | ICD-10-CM | POA: Diagnosis present

## 2019-06-15 DIAGNOSIS — E872 Acidosis, unspecified: Secondary | ICD-10-CM | POA: Diagnosis present

## 2019-06-15 DIAGNOSIS — C8590 Non-Hodgkin lymphoma, unspecified, unspecified site: Secondary | ICD-10-CM | POA: Diagnosis present

## 2019-06-15 DIAGNOSIS — I340 Nonrheumatic mitral (valve) insufficiency: Secondary | ICD-10-CM | POA: Diagnosis not present

## 2019-06-15 DIAGNOSIS — I5020 Unspecified systolic (congestive) heart failure: Secondary | ICD-10-CM

## 2019-06-15 DIAGNOSIS — I429 Cardiomyopathy, unspecified: Secondary | ICD-10-CM | POA: Diagnosis present

## 2019-06-15 DIAGNOSIS — Z20828 Contact with and (suspected) exposure to other viral communicable diseases: Secondary | ICD-10-CM | POA: Diagnosis present

## 2019-06-15 DIAGNOSIS — R002 Palpitations: Secondary | ICD-10-CM

## 2019-06-15 DIAGNOSIS — I10 Essential (primary) hypertension: Secondary | ICD-10-CM | POA: Diagnosis not present

## 2019-06-15 DIAGNOSIS — I361 Nonrheumatic tricuspid (valve) insufficiency: Secondary | ICD-10-CM

## 2019-06-15 DIAGNOSIS — R7989 Other specified abnormal findings of blood chemistry: Secondary | ICD-10-CM | POA: Diagnosis present

## 2019-06-15 DIAGNOSIS — I471 Supraventricular tachycardia: Secondary | ICD-10-CM | POA: Diagnosis present

## 2019-06-15 DIAGNOSIS — I952 Hypotension due to drugs: Secondary | ICD-10-CM | POA: Diagnosis not present

## 2019-06-15 DIAGNOSIS — I5021 Acute systolic (congestive) heart failure: Secondary | ICD-10-CM | POA: Diagnosis present

## 2019-06-15 DIAGNOSIS — R55 Syncope and collapse: Secondary | ICD-10-CM | POA: Diagnosis not present

## 2019-06-15 DIAGNOSIS — I420 Dilated cardiomyopathy: Secondary | ICD-10-CM | POA: Diagnosis not present

## 2019-06-15 DIAGNOSIS — I479 Paroxysmal tachycardia, unspecified: Secondary | ICD-10-CM

## 2019-06-15 DIAGNOSIS — R000 Tachycardia, unspecified: Secondary | ICD-10-CM | POA: Diagnosis not present

## 2019-06-15 DIAGNOSIS — D649 Anemia, unspecified: Secondary | ICD-10-CM | POA: Diagnosis present

## 2019-06-15 DIAGNOSIS — R778 Other specified abnormalities of plasma proteins: Secondary | ICD-10-CM

## 2019-06-15 DIAGNOSIS — E860 Dehydration: Secondary | ICD-10-CM | POA: Diagnosis present

## 2019-06-15 DIAGNOSIS — E876 Hypokalemia: Secondary | ICD-10-CM | POA: Diagnosis present

## 2019-06-15 DIAGNOSIS — Y92230 Patient room in hospital as the place of occurrence of the external cause: Secondary | ICD-10-CM | POA: Diagnosis not present

## 2019-06-15 DIAGNOSIS — I110 Hypertensive heart disease with heart failure: Principal | ICD-10-CM | POA: Diagnosis present

## 2019-06-15 DIAGNOSIS — T451X5A Adverse effect of antineoplastic and immunosuppressive drugs, initial encounter: Secondary | ICD-10-CM | POA: Diagnosis present

## 2019-06-15 DIAGNOSIS — E43 Unspecified severe protein-calorie malnutrition: Secondary | ICD-10-CM | POA: Diagnosis present

## 2019-06-15 DIAGNOSIS — Z6827 Body mass index (BMI) 27.0-27.9, adult: Secondary | ICD-10-CM

## 2019-06-15 DIAGNOSIS — T465X5A Adverse effect of other antihypertensive drugs, initial encounter: Secondary | ICD-10-CM | POA: Diagnosis not present

## 2019-06-15 DIAGNOSIS — M1712 Unilateral primary osteoarthritis, left knee: Secondary | ICD-10-CM | POA: Diagnosis present

## 2019-06-15 DIAGNOSIS — Z79899 Other long term (current) drug therapy: Secondary | ICD-10-CM

## 2019-06-15 LAB — TROPONIN I (HIGH SENSITIVITY)
Troponin I (High Sensitivity): 79 ng/L — ABNORMAL HIGH (ref <18)
Troponin I (High Sensitivity): 84 ng/L — ABNORMAL HIGH (ref <18)
Troponin I (High Sensitivity): 86 ng/L — ABNORMAL HIGH (ref <18)

## 2019-06-15 LAB — CBC WITH DIFFERENTIAL/PLATELET
Abs Immature Granulocytes: 0.5 10*3/uL — ABNORMAL HIGH (ref 0.00–0.07)
Basophils Absolute: 0.2 10*3/uL — ABNORMAL HIGH (ref 0.0–0.1)
Basophils Relative: 1 %
Eosinophils Absolute: 0 10*3/uL (ref 0.0–0.5)
Eosinophils Relative: 0 %
HCT: 35 % — ABNORMAL LOW (ref 36.0–46.0)
Hemoglobin: 11.4 g/dL — ABNORMAL LOW (ref 12.0–15.0)
Immature Granulocytes: 3 %
Lymphocytes Relative: 16 %
Lymphs Abs: 2.7 10*3/uL (ref 0.7–4.0)
MCH: 29.6 pg (ref 26.0–34.0)
MCHC: 32.6 g/dL (ref 30.0–36.0)
MCV: 90.9 fL (ref 80.0–100.0)
Monocytes Absolute: 2 10*3/uL — ABNORMAL HIGH (ref 0.1–1.0)
Monocytes Relative: 12 %
Neutro Abs: 11.3 10*3/uL — ABNORMAL HIGH (ref 1.7–7.7)
Neutrophils Relative %: 68 %
Platelets: 279 10*3/uL (ref 150–400)
RBC: 3.85 MIL/uL — ABNORMAL LOW (ref 3.87–5.11)
RDW: 17.3 % — ABNORMAL HIGH (ref 11.5–15.5)
WBC: 16.7 10*3/uL — ABNORMAL HIGH (ref 4.0–10.5)
nRBC: 0 % (ref 0.0–0.2)

## 2019-06-15 LAB — URINE DRUG SCREEN, QUALITATIVE (ARMC ONLY)
Amphetamines, Ur Screen: NOT DETECTED
Barbiturates, Ur Screen: NOT DETECTED
Benzodiazepine, Ur Scrn: NOT DETECTED
Cannabinoid 50 Ng, Ur ~~LOC~~: POSITIVE — AB
Cocaine Metabolite,Ur ~~LOC~~: NOT DETECTED
MDMA (Ecstasy)Ur Screen: NOT DETECTED
Methadone Scn, Ur: NOT DETECTED
Opiate, Ur Screen: NOT DETECTED
Phencyclidine (PCP) Ur S: NOT DETECTED
Tricyclic, Ur Screen: NOT DETECTED

## 2019-06-15 LAB — URINALYSIS, COMPLETE (UACMP) WITH MICROSCOPIC
Bilirubin Urine: NEGATIVE
Glucose, UA: NEGATIVE mg/dL
Hgb urine dipstick: NEGATIVE
Ketones, ur: 5 mg/dL — AB
Nitrite: NEGATIVE
Protein, ur: NEGATIVE mg/dL
Specific Gravity, Urine: 1.006 (ref 1.005–1.030)
pH: 6 (ref 5.0–8.0)

## 2019-06-15 LAB — COMPREHENSIVE METABOLIC PANEL
ALT: 22 U/L (ref 0–44)
AST: 38 U/L (ref 15–41)
Albumin: 3.6 g/dL (ref 3.5–5.0)
Alkaline Phosphatase: 64 U/L (ref 38–126)
Anion gap: 11 (ref 5–15)
BUN: 11 mg/dL (ref 8–23)
CO2: 28 mmol/L (ref 22–32)
Calcium: 9.4 mg/dL (ref 8.9–10.3)
Chloride: 104 mmol/L (ref 98–111)
Creatinine, Ser: 0.71 mg/dL (ref 0.44–1.00)
GFR calc Af Amer: >60 mL/min (ref >60)
GFR calc non Af Amer: >60 mL/min (ref >60)
Glucose, Bld: 139 mg/dL — ABNORMAL HIGH (ref 70–99)
Potassium: 3 mmol/L — ABNORMAL LOW (ref 3.5–5.1)
Sodium: 143 mmol/L (ref 135–145)
Total Bilirubin: 1 mg/dL (ref 0.3–1.2)
Total Protein: 6 g/dL — ABNORMAL LOW (ref 6.5–8.1)

## 2019-06-15 LAB — BRAIN NATRIURETIC PEPTIDE: B Natriuretic Peptide: 455 pg/mL — ABNORMAL HIGH (ref 0.0–100.0)

## 2019-06-15 LAB — ECHOCARDIOGRAM COMPLETE
Height: 64 in
Weight: 2560 oz

## 2019-06-15 LAB — T4, FREE: Free T4: 1.31 ng/dL — ABNORMAL HIGH (ref 0.61–1.12)

## 2019-06-15 LAB — SARS CORONAVIRUS 2 (TAT 6-24 HRS): SARS Coronavirus 2: NEGATIVE

## 2019-06-15 LAB — PROCALCITONIN: Procalcitonin: 0.23 ng/mL

## 2019-06-15 LAB — TSH: TSH: 3.241 u[IU]/mL (ref 0.350–4.500)

## 2019-06-15 LAB — LACTIC ACID, PLASMA
Lactic Acid, Venous: 2.3 mmol/L (ref 0.5–1.9)
Lactic Acid, Venous: 1.7 mmol/L (ref 0.5–1.9)

## 2019-06-15 LAB — MAGNESIUM: Magnesium: 1.8 mg/dL (ref 1.7–2.4)

## 2019-06-15 MED ORDER — POLYETHYLENE GLYCOL 3350 17 G PO PACK
17.0000 g | PACK | Freq: Every day | ORAL | Status: DC | PRN
  Administered 2019-06-17: 17 g via ORAL
  Filled 2019-06-15: qty 1

## 2019-06-15 MED ORDER — ASPIRIN EC 81 MG PO TBEC
81.0000 mg | DELAYED_RELEASE_TABLET | Freq: Every day | ORAL | Status: DC
  Administered 2019-06-15 – 2019-06-20 (×6): 81 mg via ORAL
  Filled 2019-06-15 (×6): qty 1

## 2019-06-15 MED ORDER — POTASSIUM CHLORIDE CRYS ER 20 MEQ PO TBCR
40.0000 meq | EXTENDED_RELEASE_TABLET | Freq: Once | ORAL | Status: AC
  Administered 2019-06-15: 40 meq via ORAL
  Filled 2019-06-15: qty 2

## 2019-06-15 MED ORDER — ADULT MULTIVITAMIN W/MINERALS CH
1.0000 | ORAL_TABLET | Freq: Every day | ORAL | Status: DC
  Administered 2019-06-16 – 2019-06-20 (×5): 1 via ORAL
  Filled 2019-06-15 (×6): qty 1

## 2019-06-15 MED ORDER — DILTIAZEM HCL 100 MG IV SOLR
INTRAVENOUS | Status: AC
  Filled 2019-06-15: qty 100

## 2019-06-15 MED ORDER — MAGNESIUM SULFATE 2 GM/50ML IV SOLN
INTRAVENOUS | Status: AC
  Filled 2019-06-15: qty 50

## 2019-06-15 MED ORDER — DILTIAZEM HCL 100 MG IV SOLR
5.0000 mg/h | INTRAVENOUS | Status: DC
  Administered 2019-06-15: 09:00:00 5 mg/h via INTRAVENOUS

## 2019-06-15 MED ORDER — ONDANSETRON HCL 4 MG/2ML IJ SOLN: 4.0000 mg | Freq: Four times a day (QID) | INTRAMUSCULAR | Status: DC | PRN

## 2019-06-15 MED ORDER — ACETAMINOPHEN 325 MG PO TABS
650.0000 mg | ORAL_TABLET | Freq: Four times a day (QID) | ORAL | Status: DC | PRN
  Administered 2019-06-18 – 2019-06-20 (×3): 650 mg via ORAL
  Filled 2019-06-15 (×3): qty 2

## 2019-06-15 MED ORDER — TRAMADOL HCL 50 MG PO TABS
50.0000 mg | ORAL_TABLET | Freq: Three times a day (TID) | ORAL | Status: DC | PRN
  Administered 2019-06-16 – 2019-06-17 (×3): 50 mg via ORAL
  Filled 2019-06-15 (×3): qty 1

## 2019-06-15 MED ORDER — HEPARIN SODIUM (PORCINE) 5000 UNIT/ML IJ SOLN
5000.0000 [IU] | Freq: Three times a day (TID) | INTRAMUSCULAR | Status: DC
  Administered 2019-06-15 – 2019-06-20 (×16): 5000 [IU] via SUBCUTANEOUS
  Filled 2019-06-15 (×16): qty 1

## 2019-06-15 MED ORDER — SODIUM CHLORIDE 0.9 % IV SOLN
INTRAVENOUS | Status: DC
  Administered 2019-06-15 – 2019-06-17 (×3): via INTRAVENOUS

## 2019-06-15 MED ORDER — MAGNESIUM SULFATE IN D5W 1-5 GM/100ML-% IV SOLN: 1.0000 g | Freq: Once | INTRAVENOUS | Status: DC

## 2019-06-15 MED ORDER — HYDRALAZINE HCL 25 MG PO TABS: 25.0000 mg | ORAL_TABLET | Freq: Three times a day (TID) | ORAL | Status: DC | PRN

## 2019-06-15 MED ORDER — DILTIAZEM LOAD VIA INFUSION
10.0000 mg | Freq: Once | INTRAVENOUS | Status: AC
  Administered 2019-06-15: 09:00:00 10 mg via INTRAVENOUS
  Filled 2019-06-15: qty 10

## 2019-06-15 MED ORDER — LACTATED RINGERS IV BOLUS
1000.0000 mL | Freq: Once | INTRAVENOUS | Status: AC
  Administered 2019-06-15: 08:00:00 1000 mL via INTRAVENOUS

## 2019-06-15 MED ORDER — LORATADINE 10 MG PO TABS
10.0000 mg | ORAL_TABLET | Freq: Every day | ORAL | Status: DC
  Administered 2019-06-16 – 2019-06-20 (×5): 10 mg via ORAL
  Filled 2019-06-15 (×6): qty 1

## 2019-06-15 MED ORDER — ACETAMINOPHEN 650 MG RE SUPP: 650.0000 mg | Freq: Four times a day (QID) | RECTAL | Status: DC | PRN

## 2019-06-15 MED ORDER — ONDANSETRON HCL 4 MG PO TABS: 4.0000 mg | ORAL_TABLET | Freq: Four times a day (QID) | ORAL | Status: DC | PRN

## 2019-06-15 MED ORDER — SODIUM CHLORIDE 0.9 % IV BOLUS
1000.0000 mL | Freq: Once | INTRAVENOUS | Status: AC
  Administered 2019-06-15: 10:00:00 1000 mL via INTRAVENOUS

## 2019-06-15 MED ORDER — MAGNESIUM SULFATE IN D5W 1-5 GM/100ML-% IV SOLN
1.0000 g | Freq: Once | INTRAVENOUS | Status: AC
  Administered 2019-06-15: 10:00:00 1 g via INTRAVENOUS
  Filled 2019-06-15: qty 100

## 2019-06-15 MED ORDER — POTASSIUM CHLORIDE 20 MEQ/15ML (10%) PO SOLN
40.0000 meq | Freq: Once | ORAL | Status: AC
  Administered 2019-06-15: 10:00:00 40 meq via ORAL
  Filled 2019-06-15: qty 30

## 2019-06-15 MED ORDER — METOPROLOL TARTRATE 50 MG PO TABS
50.0000 mg | ORAL_TABLET | Freq: Two times a day (BID) | ORAL | Status: DC
  Administered 2019-06-15 – 2019-06-16 (×3): 50 mg via ORAL
  Filled 2019-06-15 (×3): qty 1

## 2019-06-15 MED ORDER — ENSURE ENLIVE PO LIQD
237.0000 mL | Freq: Two times a day (BID) | ORAL | Status: DC
  Administered 2019-06-17 (×2): 237 mL via ORAL

## 2019-06-15 NOTE — ED Notes (Signed)
Pt offered meal tray, refused and stated she was not hungry.

## 2019-06-15 NOTE — ED Notes (Signed)
Per Dr. Rockey Situ, Give metoprolol now and d/c diltiazem drip 2 hours after metoprolol given.

## 2019-06-15 NOTE — ED Notes (Signed)
Pt changed and peri care provided 

## 2019-06-15 NOTE — Consult Note (Addendum)
Cardiology Consultation:   Patient ID: Christie Farmer; [id number]; 05-19-1941   Admit date: 06/15/2019 Date of Consult: 06/15/2019  Primary Care Provider: Patient, No Pcp Per Primary Cardiologist: New to [facility] - consult by [place]   Patient Profile:   Christie Farmer is a 78 y.o. female with a hx of follicular lymphoma diagnosed in 02/2019 on s/p chemofollowed at [facility], sinus tachycardia with PACs, and HTN who is being seen today for the evaluation of possible Afib with RVR at the request of Dr. Blaine Hamper.  History of Present Illness:   Ms. Christie Farmer has no previously known cardiac history. She was diagnosed with follicular lymphoma in [id number] and is followed at [facility]. Echo at [facility] at that time showed preserved LVSF as detailed below.   She has noted ongoing weakness/dizziness with worsening left lower extremity weakness that dates back to 04/2019 and seems to be exacerbated by prolonged exertion and chemotherapy.   She presented to [facility] ED on 12/4 with generalized weakness with her left leg "giving out" while attempting to get into her car for chemo treatment at [facility] today (today was her last treatment). She denies any presyncope or syncope in this setting. No chest pain or SOB. She did note some palpitations around 6 AM this morning that were short-lived. In the setting of generalized weakness she presented to [facility].   Upon the patient's arrival to [facility] they were found to have stable vital signs. EKG showed sinus tachycardia with frequent PACs, 142 bpm, rare PVC, nonspecific lateral st/t changes, CXR showed no acute process. Labs showed high sensitivity troponin of 79 with a delta of 84, potassium 3.0, WBC 16.7, HGB 11.4, PLT 279, magnesium 1.8, TSH normal, COVID-19 pending, blood cultures pending.   She was given IV diltiazem injection followed by diltiazem gtt along with potassium and IV magnesium. Cardiology consulted for question of Afib with RVR. No evidence of Afib on EKG or telemetry. This is  consistent with her EKG and rates at [facility] over the past few months as well.   Past Medical History:  Diagnosis Date  . Cancer ([place])   . Hypertension     Past Surgical History:  Procedure Laterality Date  . ABDOMINAL HYSTERECTOMY       Home Meds: Prior to Admission medications   Medication Sig Start Date End Date Taking? Authorizing Provider  dronabinol (MARINOL) 2.5 MG capsule Take 2.5 mg by mouth daily as needed (appetite).   Yes [provider]  famotidine (PEPCID) 20 MG tablet Take 20 mg by mouth 2 (two) times daily.   Yes [provider]  feeding supplement, ENSURE ENLIVE, (ENSURE ENLIVE) LIQD Take 237 mLs by mouth 2 (two) times daily between meals. 03/06/19  Yes Gladstone Lighter, MD  loratadine (CLARITIN) 10 MG tablet Take 10 mg by mouth daily.   Yes [provider]  Multiple Vitamin (MULTIVITAMIN WITH MINERALS) TABS tablet Take 1 tablet by mouth daily.   Yes [provider]  polyethylene glycol (MIRALAX / GLYCOLAX) 17 g packet Take 17 g by mouth daily. 03/07/19  Yes Gladstone Lighter, MD  senna (SENOKOT) 8.6 MG TABS tablet Take 1 tablet (8.6 mg total) by mouth at bedtime. 03/06/19  Yes Gladstone Lighter, MD  traMADol (ULTRAM) 50 MG tablet Take 50 mg by mouth 3 (three) times daily as needed for moderate pain.    Yes [provider]    Inpatient Medications: Scheduled Meds:  Continuous Infusions: . sodium chloride    . diltiazem (CARDIZEM) infusion 15  mg/hr (06/15/19 1005)  . magnesium sulfate bolus IVPB 1 g (06/15/19 1015)   PRN Meds:   Allergies:  No Known Allergies  Social History:   Social History   Socioeconomic History  . Marital status: Widowed    Spouse name: Not on file  . Number of children: Not on file  . Years of education: Not on file  . Highest education level: Not on file  Occupational History  . Not on file  Social Needs  . Financial resource strain: Not on file  . Food insecurity    Worry: Not on  file    Inability: Not on file  . Transportation needs    Medical: Not on file    Non-medical: Not on file  Tobacco Use  . Smoking status: Never Smoker  . Smokeless tobacco: Never Used  Substance and Sexual Activity  . Alcohol use: Never    Frequency: Never  . Drug use: Never  . Sexual activity: Not on file  Lifestyle  . Physical activity    Days per week: Not on file    Minutes per session: Not on file  . Stress: Not on file  Relationships  . Social Herbalist on phone: Not on file    Gets together: Not on file    Attends religious service: Not on file    Active member of club or organization: Not on file    Attends meetings of clubs or organizations: Not on file    Relationship status: Not on file  . Intimate partner violence    Fear of current or ex partner: Not on file    Emotionally abused: Not on file    Physically abused: Not on file    Forced sexual activity: Not on file  Other Topics Concern  . Not on file  Social History Narrative  . Not on file     Family History:   Family History  Problem Relation Age of Onset  . Cancer Brother     ROS:  Review of Systems  Constitutional: Positive for malaise/fatigue. Negative for chills, diaphoresis, fever and weight loss.  HENT: Negative for congestion.   Eyes: Negative for discharge and redness.  Respiratory: Negative for cough, hemoptysis, sputum production, shortness of breath and wheezing.   Cardiovascular: Positive for palpitations. Negative for chest pain, orthopnea, claudication, leg swelling and PND.  Gastrointestinal: Negative for abdominal pain, blood in stool, heartburn, melena, nausea and vomiting.  Genitourinary: Negative for hematuria.  Musculoskeletal: Positive for falls and joint pain. Negative for myalgias.  Skin: Negative for rash.  Neurological: Positive for dizziness and weakness. Negative for tingling, tremors, sensory change, speech change, focal weakness and loss of consciousness.   Endo/Heme/Allergies: Does not bruise/bleed easily.  Psychiatric/Behavioral: Negative for substance abuse. The patient is not nervous/anxious.   All other systems reviewed and are negative.     Physical Exam/Data:   Vitals:   06/15/19 0907 06/15/19 0916 06/15/19 0930 06/15/19 1000  BP:   125/76 132/81  Pulse:  95  (!) 123  Resp: 17 17 17  (!) 22  Temp:      TempSrc:      SpO2: 100% 100% 100% 100%  Weight:      Height:       No intake or output data in the 24 hours ending 06/15/19 1046 Filed Weights   06/15/19 0742  Weight: 72.6 kg   Body mass index is 27.46 kg/m.   Physical Exam: General: Well developed, well  nourished, in no acute distress. Head: Normocephalic, atraumatic, sclera non-icteric, no xanthomas, nares without discharge.  Neck: Negative for carotid bruits. JVD not elevated. Lungs: Clear bilaterally to auscultation without wheezes, rales, or rhonchi. Breathing is unlabored. Heart: Tachycardic, irregular with S1 S2. No murmurs, rubs, or gallops appreciated. Abdomen: Soft, non-tender, non-distended with normoactive bowel sounds. No hepatomegaly. No rebound/guarding. No obvious abdominal masses. Msk:  Strength and tone appear normal for age. Extremities: No clubbing or cyanosis. No edema. Distal pedal pulses are 2+ and equal bilaterally. Neuro: Alert and oriented X 3. No facial asymmetry. No focal deficit. Moves all extremities spontaneously. Psych:  Responds to questions appropriately with a normal affect.   EKG:  The EKG was personally reviewed and demonstrates: sinus tachycardia with frequent PACs, 142 bpm, rare PVC, nonspecific lateral st/t changes Telemetry:  Telemetry was personally reviewed and demonstrates: sinus tachycardia with PACs, no evidence of Afib  Weights: Filed Weights   06/15/19 0742  Weight: 72.6 kg    Relevant CV Studies:  2D Echo 02/2019 at [facility]:  Normal left ventricular systolic function, ejection fraction > 55%  Normal right  ventricular systolic function  Laboratory Data:  Chemistry Recent Labs  Lab 06/15/19 0746  NA 143  K 3.0*  CL 104  CO2 28  GLUCOSE 139*  BUN 11  CREATININE 0.71  CALCIUM 9.4  GFRNONAA >60  GFRAA >60  ANIONGAP 11    Recent Labs  Lab 06/15/19 0746  PROT 6.0*  ALBUMIN 3.6  AST 38  ALT 22  ALKPHOS 64  BILITOT 1.0   Hematology Recent Labs  Lab 06/15/19 0746  WBC 16.7*  RBC 3.85*  HGB 11.4*  HCT 35.0*  MCV 90.9  MCH 29.6  MCHC 32.6  RDW 17.3*  PLT 279   Cardiac EnzymesNo results for input(s): TROPONINI in the last 168 hours. No results for input(s): TROPIPOC in the last 168 hours.  BNPNo results for input(s): BNP, PROBNP in the last 168 hours.  DDimer No results for input(s): DDIMER in the last 168 hours.  Radiology/Studies:  Dg Knee 2 Views Left  Result Date: 06/15/2019 IMPRESSION: No acute fracture or dislocation. Osteoarthritic changes of left knee. Electronically Signed   By: Abelardo Diesel M.D.   On: 06/15/2019 09:05   Dg Chest Portable 1 View  Result Date: 06/15/2019 IMPRESSION: No acute process in the chest. Electronically Signed   By: Macy Mis M.D.   On: 06/15/2019 08:16    Assessment and Plan:   1. Sinus tachycardia with frequent PACs: -EKG shows sinus tachycardia with frequent PACs and rare PVC with heart rates in the 140s bpm. This is consistent with her EKG at [facility] in 04/2019 -Telemetry shows sinus tachycardia with PACs -No evidence of Afib -Cannot exclude SVT vs atrial tach occurring when she over exerts herself/gets hot -Repeat EKG when rate slows -Stop diltiazem gtt -Start Lopressor 50 mg bid -No indication for anticoagulation  -Recommend outpatient cardiac monitoring -Echo pending -Likely in the setting of hypokalemia and underlying lymphoma with chemotherapy   2. Elevated high sensitivity troponin: -Minimally elevated high sensitivity troponin not consistent with ACS -Continue to cycle to trend/peak -Check echo -No indication  for heparin gtt at this time  3. Left leg weakness/generalized weakness/presyncope: -Likely in the setting of ongoing chemotherapy and underlying lymphoma and mild anemia -Monitor on telemetry -Outpatient cardiac monitoring as above -Cultures pending  4. Hypokalemia: -Replete to goal of 4.0   For questions or updates, please contact [place] Please consult [email or web address]  for contact info under Cardiology/STEMI.   Signed, Christell Faith, PA-C [facility] Pager: [phone] 06/15/2019, 10:46 AM

## 2019-06-15 NOTE — Telephone Encounter (Signed)
Order placed for zio at per Christell Faith, PA request. Registered in zio site for home delivery.   Call Monday to patient for instructions.

## 2019-06-15 NOTE — H&P (Signed)
History and Physical    Christie Farmer [id number] DOB: 1941/05/15 DOA: 06/15/2019  Referring MD/NP/PA:   PCP: Patient, No Pcp Per   Patient coming from:  The patient is coming from home.  At baseline, pt is independent for most of ADL.        Chief Complaint: near syncope  HPI: Christie Farmer is a 78 y.o. female with medical history significant of hypertension, GERD, lymphoma on chemotherapy in [facility], who presents with near syncope.  Pt states that she is currently doing chemotherapy in [facility], which causes generalized weakness.  She also has lightheadedness and dizziness sometimes.  In several situation, she almost passed out, but did not.  She states that she fell a week ago with left knee injury, but strongly denies head or neck injury.  She refused my offer of CT scan of head and neck. She was found to have tachycardia with heart rate up to 140s in ED. She had heart racing feeling in the early morning, which has resolved. Patient denies chest pain, shortness of breath.  No cough, fever or chills.  Patient does not have nausea, vomiting, diarrhea, abdominal pain, symptoms of UTI.  No unilateral weakness or numbness in extremities.  No facial droop or slurred speech.  ED Course: pt was found to have troponin 79, 84, 86, K 3.0, Mg 1.8, WBC 16.7, HGB 11.4, PLT 279, TSH normal, COVID-19 pending, blood culture and urine culture pending. Due to concerning of A fib with RVR, pt was given IV diltiazem injection followed by diltiazem gtt in ED. chest x-ray negative.  X-ray of left knee is negative for bony fracture.  Lactic acid 2.3 -->1.7. pt is admitted to tele bed as inpt. Card was consulted.  Review of Systems:   General: no fevers, chills, no body weight gain, has fatigue HEENT: no blurry vision, hearing changes or sore throat Respiratory: no dyspnea, coughing, wheezing CV: no chest pain, has palpitations GI: no nausea, vomiting, abdominal pain, diarrhea, constipation GU: no dysuria, burning  on urination, increased urinary frequency, hematuria  Ext: no leg edema Neuro: no unilateral weakness, numbness, or tingling, no vision change or hearing loss. Has dizziness. Skin: no rash, no skin tear. MSK: No muscle spasm, no deformity, no limitation of range of movement in spin Heme: No easy bruising.  Travel history: No recent long distant travel.  Allergy: No Known Allergies  Past Medical History:  Diagnosis Date   Cancer ([place])    Hypertension     Past Surgical History:  Procedure Laterality Date   ABDOMINAL HYSTERECTOMY      Social History:  reports that she has never smoked. She has never used smokeless tobacco. She reports that she does not drink alcohol or use drugs.  Family History:  Family History  Problem Relation Age of Onset   Cancer Brother      Prior to Admission medications   Medication Sig Start Date End Date Taking? Authorizing Provider  amLODipine (NORVASC) 10 MG tablet Take 10 mg by mouth daily. 02/24/19 02/24/20  [provider]  feeding supplement, ENSURE ENLIVE, (ENSURE ENLIVE) LIQD Take 237 mLs by mouth 2 (two) times daily between meals. 03/06/19   Gladstone Lighter, MD  loratadine (CLARITIN) 10 MG tablet Take 10 mg by mouth daily.    [provider]  metoCLOPramide (REGLAN) 5 MG tablet Take 1 tablet (5 mg total) by mouth 3 (three) times daily before meals. 03/06/19 05/05/19  Gladstone Lighter, MD  polyethylene glycol (MIRALAX / GLYCOLAX) 17 g  packet Take 17 g by mouth daily. 03/07/19   Gladstone Lighter, MD  senna (SENOKOT) 8.6 MG TABS tablet Take 1 tablet (8.6 mg total) by mouth at bedtime. 03/06/19   Gladstone Lighter, MD    Physical Exam: Vitals:   06/15/19 1400 06/15/19 1430 06/15/19 1500 06/15/19 1530  BP: 108/71 104/67 (!) 104/58 118/78  Pulse: 91 88 91 98  Resp:   20   Temp:      TempSrc:      SpO2: 97% 100% 100% 100%  Weight:      Height:       General: Not in acute distress.  Dry mucus membrane HEENT:        Eyes: PERRL, EOMI, no scleral icterus.       ENT: No discharge from the ears and nose, no pharynx injection, no tonsillar enlargement.        Neck: No JVD, no bruit, no mass felt. Heme: No neck lymph node enlargement. Cardiac: S1/S2, irregular heartbeat, no murmurs, No gallops or rubs. Respiratory:  No rales, wheezing, rhonchi or rubs. GI: Soft, nondistended, nontender, no rebound pain, no organomegaly, BS present. GU: No hematuria Ext: No pitting leg edema bilaterally. 2+DP/PT pulse bilaterally. Musculoskeletal: No joint deformities, No joint redness or warmth, no limitation of ROM in spin. Skin: No rashes.  Neuro: Alert, oriented X3, cranial nerves II-XII grossly intact, moves all extremities normally. Psych: Patient is not psychotic, no suicidal or hemocidal ideation.  Labs on Admission: I have personally reviewed following labs and imaging studies  CBC: Recent Labs  Lab 06/15/19 0746  WBC 16.7*  NEUTROABS 11.3*  HGB 11.4*  HCT 35.0*  MCV 90.9  PLT 123XX123   Basic Metabolic Panel: Recent Labs  Lab 06/15/19 0746  NA 143  K 3.0*  CL 104  CO2 28  GLUCOSE 139*  BUN 11  CREATININE 0.71  CALCIUM 9.4  MG 1.8   GFR: Estimated Creatinine Clearance: 56.6 mL/min (by C-G formula based on SCr of 0.71 mg/dL). Liver Function Tests: Recent Labs  Lab 06/15/19 0746  AST 38  ALT 22  ALKPHOS 64  BILITOT 1.0  PROT 6.0*  ALBUMIN 3.6   No results for input(s): LIPASE, AMYLASE in the last 168 hours. No results for input(s): AMMONIA in the last 168 hours. Coagulation Profile: No results for input(s): INR, PROTIME in the last 168 hours. Cardiac Enzymes: No results for input(s): CKTOTAL, CKMB, CKMBINDEX, TROPONINI in the last 168 hours. BNP (last 3 results) No results for input(s): PROBNP in the last 8760 hours. HbA1C: No results for input(s): HGBA1C in the last 72 hours. CBG: No results for input(s): GLUCAP in the last 168 hours. Lipid Profile: No results for input(s): CHOL,  HDL, LDLCALC, TRIG, CHOLHDL, LDLDIRECT in the last 72 hours. Thyroid Function Tests: Recent Labs    06/15/19 0746  TSH 3.241  FREET4 1.31*   Anemia Panel: No results for input(s): VITAMINB12, FOLATE, FERRITIN, TIBC, IRON, RETICCTPCT in the last 72 hours. Urine analysis:    Component Value Date/Time   COLORURINE YELLOW (A) 06/15/2019 0855   APPEARANCEUR CLEAR (A) 06/15/2019 0855   LABSPEC 1.006 06/15/2019 0855   PHURINE 6.0 06/15/2019 0855   GLUCOSEU NEGATIVE 06/15/2019 0855   HGBUR NEGATIVE 06/15/2019 0855   BILIRUBINUR NEGATIVE 06/15/2019 0855   KETONESUR 5 (A) 06/15/2019 0855   PROTEINUR NEGATIVE 06/15/2019 0855   NITRITE NEGATIVE 06/15/2019 0855   LEUKOCYTESUR SMALL (A) 06/15/2019 0855   Sepsis Labs: @LABRCNTIP (procalcitonin:4,lacticidven:4) )No results found for this or  any previous visit (from the past 240 hour(s)).   Radiological Exams on Admission: Dg Knee 2 Views Left  Result Date: 06/15/2019 CLINICAL DATA:  Left knee pain EXAM: LEFT KNEE - 1-2 VIEW COMPARISON:  None. FINDINGS: No acute fracture or dislocation or joint effusion is identified. Narrow femoral tibial joint space with osteophyte formation are noted. Chondrocalcinosis is identified in the femoral tibial joint. IMPRESSION: No acute fracture or dislocation. Osteoarthritic changes of left knee. Electronically Signed   By: Abelardo Diesel M.D.   On: 06/15/2019 09:05   Dg Chest Portable 1 View  Result Date: 06/15/2019 CLINICAL DATA:  Generalized weakness EXAM: PORTABLE CHEST 1 VIEW COMPARISON:  None. FINDINGS: Right chest wall port catheter tip is near the cavoatrial junction. Low lung volumes. No focal consolidation or edema. No pleural effusion or pneumothorax. Normal heart size. Calcification is present along the aortic arch. IMPRESSION: No acute process in the chest. Electronically Signed   By: Macy Mis M.D.   On: 06/15/2019 08:16     EKG: Independently reviewed.  Questionable atrial fibrillation, QTc  478 and nonspecific T wave change. Per cardiologist, patient has no atrial fibrillation, but showed sinus tachycardia with frequent PACs and rare PVC, nonspecific lateral st/t changes    Assessment/Plan Principal Problem:   Elevated troponin Active Problems:   Hypertension   Lymphoma (HCC)   Hypokalemia   Lactic acidosis   Sinus tachycardia   Near syncope  Sinus tachycardia with frequent PACs: initially suspected A fib with RVR. Card was consulted, per Dr. Oley Balm, pt does not have atrial fibrillation, patient most likely to have sinus tachycardia with frequent PAC, though SVT cannot be completely ruled out.  Dr. Rockey Situ will Zio monitor as outpatient  -will admit to tele bed as inpt -highly appreciated cardiologist consultation, will follow up recommendations -Stop diltiazem gtt -Start Lopressor 50 mg bid -Echo pending -will keep K above 4.0 and Mg above 2.0  Elevated troponin: no CP.  Possible demand ischemia. -trend trop -check BNP -f/u 2e echo - start ASA -check A1c and FLp  Near syncope: Likely multifactorial etiology, including tachycardia, dehydration, ongoing chemotherapy which is causing weakness. -pt/OT -pt refused CT-head -IVF  Hypokalemia: K=3.3  on admission. Mg 1.8 - Repleted k (goal of 4.0) - Give 1 g of magnesium sulfate  Hypertension: -prn hydralazine  Lymphoma ([place]): on chemo currently -f/u in [facility] with oncology  Lactic acidosis: Lactic acid 2.3, which is resolved --> 1.7 with IVF. No fever. No signs of infection.  Low suspicions for sepsis.  Possibly due to dehydration -IV fluid: 1 L normal saline, followed by 75 cc/h  UA result: Urinalysis showed clear appearance, small amount of leukocyte, rare bacteria, WBC 11-20.  Patient strongly denies any symptoms of UTI. Pt has  -Hold antibiotics now -Follow-up urine culture   Inpatient status:  # Patient requires inpatient status due to high intensity of service, high risk for further deterioration and  high frequency of surveillance required.  I certify that at the point of admission it is my clinical judgment that the patient will require inpatient hospital care spanning beyond 2 midnights from the point of admission.   This patient has multiple chronic comorbidities including hypertension, GERD, lymphoma on chemotherapy in [facility]  Now patient has presenting with near syncope, sinus tachycardia with frequent PAC, lactic acidosis, elevated troponin, hypokalemia  The worrisome physical exam findings include irregular heart rhythm, dry mucous and membrane  The initial radiographic and laboratory data are worrisome because of sinus rhythm  with frequent PACs on EKG, hypokalemia, leukocytosis, elevated lactic acid, elevated troponin  Current medical needs: please see my assessment and plan  Predictability of an adverse outcome (risk): Patient has multiple comorbidities, currently on chemotherapy for lymphoma, presents with near syncope, sinus tachycardia with frequent PAC, lactic acidosis, elevated troponin, hypokalemia        DVT ppx: SQ Heparin   Code Status: Full code Family Communication: Yes, patient's daughter at bed side Disposition Plan:  Anticipate discharge back to previous home environment Consults called:  Card Admission status: Inpatient/tele   Date of Service 06/15/2019    [facility]   If 7PM-7AM, please contact night-coverage [email or web address] Password [email or web address] 06/15/2019, 4:32 PM

## 2019-06-15 NOTE — ED Provider Notes (Signed)
[facility] Emergency Department Provider Note   ____________________________________________   First MD Initiated Contact with Patient 06/15/19 0745     (approximate)  I have reviewed the triage vital signs and the nursing notes.   HISTORY  Chief Complaint Near Syncope    HPI Christie Farmer is a 78 y.o. female with past medical history of hypertension and lymphoma presents to the ED for generalized weakness.  Patient reports that she had a fall approximately 1 week ago where she struck her left leg.  She had been dealing with pain in this leg but states her primary care doctor provided pain medication and she had been feeling better.  However, over the past couple of days she has been feeling increasingly weak. When she attempted to get in the car for chemotherapy at [facility] earlier today, was unable to do so, felt very lightheaded on multiple occasions and nearly passed out.  She states today was supposed to be her last dose of chemotherapy and she has otherwise been doing well.  She denies any recent fevers, chills, cough, chest pain, shortness of breath, dysuria, or hematuria.  She is not aware of any history of abnormal heart rhythms, states she has dealt with similar episodes in the past but never so many in rapid succession.        Past Medical History:  Diagnosis Date  . Cancer ([place])   . Hypertension     Patient Active Problem List   Diagnosis Date Noted  . ARF (acute renal failure) ([place]) 03/04/2019  . Generalized weakness 03/02/2019    Past Surgical History:  Procedure Laterality Date  . ABDOMINAL HYSTERECTOMY      Prior to Admission medications   Medication Sig Start Date End Date Taking? Authorizing Provider  amLODipine (NORVASC) 10 MG tablet Take 10 mg by mouth daily. 02/24/19 02/24/20  [provider]  feeding supplement, ENSURE ENLIVE, (ENSURE ENLIVE) LIQD Take 237 mLs by mouth 2 (two) times daily between meals. 03/06/19    Gladstone Lighter, MD  loratadine (CLARITIN) 10 MG tablet Take 10 mg by mouth daily.    [provider]  metoCLOPramide (REGLAN) 5 MG tablet Take 1 tablet (5 mg total) by mouth 3 (three) times daily before meals. 03/06/19 05/05/19  Gladstone Lighter, MD  polyethylene glycol (MIRALAX / GLYCOLAX) 17 g packet Take 17 g by mouth daily. 03/07/19   Gladstone Lighter, MD  senna (SENOKOT) 8.6 MG TABS tablet Take 1 tablet (8.6 mg total) by mouth at bedtime. 03/06/19   Gladstone Lighter, MD    Allergies Patient has no known allergies.  No family history on file.  Social History Social History   Tobacco Use  . Smoking status: Never Smoker  . Smokeless tobacco: Never Used  Substance Use Topics  . Alcohol use: Never    Frequency: Never  . Drug use: Never    Review of Systems  Constitutional: No fever/chills.  Positive for generalized weakness. Eyes: No visual changes. ENT: No sore throat. Cardiovascular: Denies chest pain.  Positive for lightheadedness and near syncope. Respiratory: Denies shortness of breath. Gastrointestinal: No abdominal pain.  No nausea, no vomiting.  No diarrhea.  No constipation. Genitourinary: Negative for dysuria. Musculoskeletal: Negative for back pain. Skin: Negative for rash. Neurological: Negative for headaches, focal weakness or numbness.  ____________________________________________   PHYSICAL EXAM:  VITAL SIGNS: ED Triage Vitals  Enc Vitals Group     BP 06/15/19 0741 (!) 149/89     Pulse Rate 06/15/19 0741 Marland Kitchen)  142     Resp 06/15/19 0741 20     Temp 06/15/19 0741 97.7 F (36.5 C)     Temp Source 06/15/19 0741 Oral     SpO2 06/15/19 0741 100 %     Weight 06/15/19 0742 160 lb (72.6 kg)     Height 06/15/19 0742 5\' 4"  (1.626 m)     Head Circumference --      Peak Flow --      Pain Score 06/15/19 0741 0     Pain Loc --      Pain Edu? --      Excl. in [place]? --     Constitutional: Alert and oriented. Eyes: Conjunctivae are normal.  Head: Atraumatic. Nose: No congestion/rhinnorhea. Mouth/Throat: Mucous membranes are moist. Neck: Normal ROM Cardiovascular: Tachycardic, irregularly irregular rhythm. Grossly normal heart sounds.  Right chest wall Mediport. Respiratory: Normal respiratory effort.  No retractions. Lungs CTAB. Gastrointestinal: Soft and nontender. No distention. Genitourinary: deferred Musculoskeletal: No lower extremity tenderness nor edema. Neurologic:  Normal speech and language. No gross focal neurologic deficits are appreciated. Skin:  Skin is warm, dry and intact. No rash noted. Psychiatric: Mood and affect are normal. Speech and behavior are normal.  ____________________________________________   LABS (all labs ordered are listed, but only abnormal results are displayed)  Labs Reviewed  COMPREHENSIVE METABOLIC PANEL - Abnormal; Notable for the following components:      Result Value   Potassium 3.0 (*)    Glucose, Bld 139 (*)    Total Protein 6.0 (*)    All other components within normal limits  CBC WITH DIFFERENTIAL/PLATELET - Abnormal; Notable for the following components:   WBC 16.7 (*)    RBC 3.85 (*)    Hemoglobin 11.4 (*)    HCT 35.0 (*)    RDW 17.3 (*)    Neutro Abs 11.3 (*)    Monocytes Absolute 2.0 (*)    Basophils Absolute 0.2 (*)    Abs Immature Granulocytes 0.50 (*)    All other components within normal limits  URINALYSIS, COMPLETE (UACMP) WITH MICROSCOPIC - Abnormal; Notable for the following components:   Color, Urine YELLOW (*)    APPearance CLEAR (*)    Ketones, ur 5 (*)    Leukocytes,Ua SMALL (*)    Bacteria, UA RARE (*)    All other components within normal limits  LACTIC ACID, PLASMA - Abnormal; Notable for the following components:   Lactic Acid, Venous 2.3 (*)    All other components within normal limits  T4, FREE - Abnormal; Notable for the following components:   Free T4 1.31 (*)    All other components within normal limits  TROPONIN I (HIGH SENSITIVITY)  - Abnormal; Notable for the following components:   Troponin I (High Sensitivity) 79 (*)    All other components within normal limits  SARS CORONAVIRUS 2 (TAT 6-24 HRS)  URINE CULTURE  MAGNESIUM  TSH  LACTIC ACID, PLASMA  TROPONIN I (HIGH SENSITIVITY)   ____________________________________________  EKG  ED ECG REPORT I, Blake Divine, the attending physician, personally viewed and interpreted this ECG.   Date: 06/15/2019  EKG Time: 7:44  Rate: 142  Rhythm: atrial fibrillation, rate 142  Axis: Normal  Intervals:none  ST&T Change: None   PROCEDURES  Procedure(s) performed (including Critical Care):  .Critical Care Performed by: Blake Divine, MD Authorized by: Blake Divine, MD   Critical care provider statement:    Critical care time (minutes):  45   Critical care time was  exclusive of:  Separately billable procedures and treating other patients and teaching time   Critical care was necessary to treat or prevent imminent or life-threatening deterioration of the following conditions:  Cardiac failure   Critical care was time spent personally by me on the following activities:  Discussions with consultants, evaluation of patient's response to treatment, examination of patient, ordering and performing treatments and interventions, ordering and review of laboratory studies, ordering and review of radiographic studies, pulse oximetry, re-evaluation of patient's condition, obtaining history from patient or surrogate and review of old charts   I assumed direction of critical care for this patient from another provider in my specialty: no       ____________________________________________   INITIAL IMPRESSION / ASSESSMENT AND PLAN / ED COURSE       78 year old female with history of lymphoma, currently receiving chemotherapy, presents to the ED with generalized weakness over the past couple of days, had multiple near syncopal episodes this morning.  She is awake and  alert on arrival, denies falling to the ground or any trauma related to these episodes.  She has no tenderness to her left lower extremity from reported prior to fall, range of motion intact.  She is noted to be tachycardic with irregularly irregular rhythm and EKG confirms atrial fibrillation.  She has no record of a history of this in her chart.  Blood pressure is currently stable, will start Cardizem bolus and drip.  We will also check for infectious process contributing to new onset A. fib as well as thyroid studies.  Plan to hydrate with IV fluids, initial presentation does not appear consistent with sepsis.  Patient's heart rate had some improvement following fluid bolus, but continues to have A. fib with RVR.  She was started on Cardizem drip with improvement, heart rate now consistently around 100.  Labs significant for hypokalemia as well as mild elevation in troponin, low suspicion for ACS given her lack of chest pain.  No apparent infectious process on chest x-ray or UA.  Will plan for admission given new onset A. fib.  Case discussed with hospitalist, who accepts patient for admission.      ____________________________________________   FINAL CLINICAL IMPRESSION(S) / ED DIAGNOSES  Final diagnoses:  Atrial fibrillation with RVR (HCC)  Lymphoma, unspecified body region, unspecified lymphoma type [facility])     ED Discharge Orders    None       Note:  This document was prepared using Dragon voice recognition software and may include unintentional dictation errors.   Blake Divine, MD 06/15/19 [phone]

## 2019-06-15 NOTE — ED Triage Notes (Signed)
To ER via [facility] from home due to near syncope. Pt reporting generalized weakness. Denies pain. Pt tachycardic with EMS 120-130. CBG 142, other VS WNL. Pt receiving chemo for lymphoma. Pt alert and oriented X4, cooperative, RR even and unlabored, color WNL. Pt in NAD. EDP at bedside on arrival. No chest pain or SOB.

## 2019-06-15 NOTE — ED Notes (Signed)
Spoke to Dr. Charna Archer regarding patient's improving HR, orders to hold cardizem at this time and let remainder of IVF bolus infuse then to reevaluate pt HR.

## 2019-06-15 NOTE — ED Notes (Signed)
ED Provider at bedside. 

## 2019-06-15 NOTE — ED Notes (Signed)
Pt given ice water.

## 2019-06-15 NOTE — ED Notes (Signed)
After EDP reeval, verbal orders to give ordered cardziem bolus and drip as pt HR starts to elevate again.

## 2019-06-15 NOTE — ED Notes (Signed)
ED TO INPATIENT HANDOFF REPORT  ED Nurse Name and Phone #: ally [phone]  S Name/Age/Gender Christie Farmer 78 y.o. female Room/Bed: ED07A/ED07A  Code Status   Code Status: Prior  Home/SNF/Other Home Patient oriented to: self, place, time and situation Is this baseline? Yes   Triage Complete: Triage complete  Chief Complaint near syncopal episode  Triage Note To ER via [facility] from home due to near syncope. Pt reporting generalized weakness. Denies pain. Pt tachycardic with EMS 120-130. CBG 142, other VS WNL. Pt receiving chemo for lymphoma. Pt alert and oriented X4, cooperative, RR even and unlabored, color WNL. Pt in NAD. EDP at bedside on arrival. No chest pain or SOB.   Allergies No Known Allergies  Level of Care/Admitting Diagnosis ED Disposition    ED Disposition Condition [place] [facility] Area: [place] [[id number]]  Level of Care: Stepdown [14]  Covid Evaluation: Asymptomatic Screening Protocol (No Symptoms)  Diagnosis: [id number]  Admitting Physician: Ivor Costa [[id number]]  Attending Physician: Ivor Costa [phone]  Estimated length of stay: past midnight tomorrow  Certification:: I certify this patient will need inpatient services for at least 2 midnights  PT Class (Do Not Modify): Inpatient [101]  PT Acc Code (Do Not Modify): Private [1]       B Medical/Surgery History Past Medical History:  Diagnosis Date  . Cancer ([place])   . Hypertension    Past Surgical History:  Procedure Laterality Date  . ABDOMINAL HYSTERECTOMY       A IV Location/Drains/Wounds Patient Lines/Drains/Airways Status   Active Line/Drains/Airways    Name:   Placement date:   Placement time:   Site:   Days:   Implanted Port 06/15/19   06/15/19    1047    -   less than 1   Peripheral IV 06/15/19 Right Antecubital   06/15/19    0750    Antecubital   less than 1   Incision (Closed) 02/22/19 Neck Right   02/22/19    1830     113           Intake/Output Last 24 hours No intake or output data in the 24 hours ending 06/15/19 1107  Labs/Imaging Results for orders placed or performed during the hospital encounter of 06/15/19 (from the past 48 hour(s))  Comprehensive metabolic panel     Status: Abnormal   Collection Time: 06/15/19  7:46 AM  Result Value Ref Range   Sodium 143 135 - 145 mmol/L   Potassium 3.0 (L) 3.5 - 5.1 mmol/L   Chloride 104 98 - 111 mmol/L   CO2 28 22 - 32 mmol/L   Glucose, Bld 139 (H) 70 - 99 mg/dL   BUN 11 8 - 23 mg/dL   Creatinine, Ser 0.71 0.44 - 1.00 mg/dL   Calcium 9.4 8.9 - 10.3 mg/dL   Total Protein 6.0 (L) 6.5 - 8.1 g/dL   Albumin 3.6 3.5 - 5.0 g/dL   AST 38 15 - 41 U/L   ALT 22 0 - 44 U/L   Alkaline Phosphatase 64 38 - 126 U/L   Total Bilirubin 1.0 0.3 - 1.2 mg/dL   GFR calc non Af Amer >60 >60 mL/min   GFR calc Af Amer >60 >60 mL/min   Anion gap 11 5 - 15    Comment: Performed at [facility], [address] [place], [place] [address]  CBC with Differential     Status: Abnormal  Collection Time: 06/15/19  7:46 AM  Result Value Ref Range   WBC 16.7 (H) 4.0 - 10.5 K/uL   RBC 3.85 (L) 3.87 - 5.11 MIL/uL   Hemoglobin 11.4 (L) 12.0 - 15.0 g/dL   HCT 35.0 (L) 36.0 - 46.0 %   MCV 90.9 80.0 - 100.0 fL   MCH 29.6 26.0 - 34.0 pg   MCHC 32.6 30.0 - 36.0 g/dL   RDW 17.3 (H) 11.5 - 15.5 %   Platelets 279 150 - 400 K/uL   nRBC 0.0 0.0 - 0.2 %   Neutrophils Relative % 68 %   Neutro Abs 11.3 (H) 1.7 - 7.7 K/uL   Lymphocytes Relative 16 %   Lymphs Abs 2.7 0.7 - 4.0 K/uL   Monocytes Relative 12 %   Monocytes Absolute 2.0 (H) 0.1 - 1.0 K/uL   Eosinophils Relative 0 %   Eosinophils Absolute 0.0 0.0 - 0.5 K/uL   Basophils Relative 1 %   Basophils Absolute 0.2 (H) 0.0 - 0.1 K/uL   Immature Granulocytes 3 %   Abs Immature Granulocytes 0.50 (H) 0.00 - 0.07 K/uL    Comment: Performed at [facility], [address] [place], [place] [address]  Magnesium      Status: None   Collection Time: 06/15/19  7:46 AM  Result Value Ref Range   Magnesium 1.8 1.7 - 2.4 mg/dL    Comment: Performed at [facility], [place], [place] [address]  Troponin I (High Sensitivity)     Status: Abnormal   Collection Time: 06/15/19  7:46 AM  Result Value Ref Range   Troponin I (High Sensitivity) 79 (H) <18 ng/L    Comment: (NOTE) Elevated high sensitivity troponin I (hsTnI) values and significant  changes across serial measurements may suggest ACS but many other  chronic and acute conditions are known to elevate hsTnI results.  Refer to the "Links" section for chest pain algorithms and additional  guidance. Performed at [facility], [address] [place], [place] [address]   Lactic acid, plasma     Status: Abnormal   Collection Time: 06/15/19  7:46 AM  Result Value Ref Range   Lactic Acid, Venous 2.3 (HH) 0.5 - 1.9 mmol/L    Comment: CRITICAL RESULT CALLED TO, READ BACK BY AND VERIFIED WITH ALLEY YAW ON 06/15/19 AT 0829 JJB Performed at [facility], [address] [place], [place] [address]   TSH     Status: None   Collection Time: 06/15/19  7:46 AM  Result Value Ref Range   TSH 3.241 0.350 - 4.500 uIU/mL    Comment: Performed by a 3rd Generation assay with a functional sensitivity of <=0.01 uIU/mL. Performed at [facility], [address] [place], [place] [address]   T4, free     Status: Abnormal   Collection Time: 06/15/19  7:46 AM  Result Value Ref Range   Free T4 1.31 (H) 0.61 - 1.12 ng/dL    Comment: (NOTE) Biotin ingestion may interfere with free T4 tests. If the results are inconsistent with the TSH level, previous test results, or the clinical presentation, then consider biotin interference. If needed, order repeat testing after stopping biotin. Performed at [facility], [address] [place],  [address]   BNP     Status: Abnormal   Collection Time: 06/15/19   7:46 AM  Result Value Ref Range   B Natriuretic Peptide 455.0 (H) 0.0 - 100.0 pg/mL  Comment: Performed at [facility], [address] [place], [place] [address]  Urinalysis, Complete w Microscopic     Status: Abnormal   Collection Time: 06/15/19  8:55 AM  Result Value Ref Range   Color, Urine YELLOW (A) YELLOW   APPearance CLEAR (A) CLEAR   Specific Gravity, Urine 1.006 1.005 - 1.030   pH 6.0 5.0 - 8.0   Glucose, UA NEGATIVE NEGATIVE mg/dL   Hgb urine dipstick NEGATIVE NEGATIVE   Bilirubin Urine NEGATIVE NEGATIVE   Ketones, ur 5 (A) NEGATIVE mg/dL   Protein, ur NEGATIVE NEGATIVE mg/dL   Nitrite NEGATIVE NEGATIVE   Leukocytes,Ua SMALL (A) NEGATIVE   RBC / HPF 0-5 0 - 5 RBC/hpf   WBC, UA 11-20 0 - 5 WBC/hpf   Bacteria, UA RARE (A) NONE SEEN   Squamous Epithelial / LPF 0-5 0 - 5   Mucus PRESENT     Comment: Performed at [facility], [place], [place] [address]  Troponin I (High Sensitivity)     Status: Abnormal   Collection Time: 06/15/19 10:06 AM  Result Value Ref Range   Troponin I (High Sensitivity) 84 (H) <18 ng/L    Comment: (NOTE) Elevated high sensitivity troponin I (hsTnI) values and significant  changes across serial measurements may suggest ACS but many other  chronic and acute conditions are known to elevate hsTnI results.  Refer to the "Links" section for chest pain algorithms and additional  guidance. Performed at [facility], [address] [place], [place] [address]   Lactic acid, plasma     Status: None   Collection Time: 06/15/19 10:06 AM  Result Value Ref Range   Lactic Acid, Venous 1.7 0.5 - 1.9 mmol/L    Comment: Performed at [facility], [address] [place], [place] [address]   Dg Knee 2 Views Left  Result Date: 06/15/2019 CLINICAL DATA:  Left knee pain EXAM: LEFT KNEE - 1-2 VIEW COMPARISON:  None. FINDINGS: No acute fracture or dislocation or joint effusion is identified. Narrow femoral  tibial joint space with osteophyte formation are noted. Chondrocalcinosis is identified in the femoral tibial joint. IMPRESSION: No acute fracture or dislocation. Osteoarthritic changes of left knee. Electronically Signed   By: Abelardo Diesel M.D.   On: 06/15/2019 09:05   Dg Chest Portable 1 View  Result Date: 06/15/2019 CLINICAL DATA:  Generalized weakness EXAM: PORTABLE CHEST 1 VIEW COMPARISON:  None. FINDINGS: Right chest wall port catheter tip is near the cavoatrial junction. Low lung volumes. No focal consolidation or edema. No pleural effusion or pneumothorax. Normal heart size. Calcification is present along the aortic arch. IMPRESSION: No acute process in the chest. Electronically Signed   By: Macy Mis M.D.   On: 06/15/2019 08:16    Pending Labs Unresulted Labs (From admission, onward)    Start     Ordered   06/15/19 1007  Procalcitonin  ONCE - STAT,   STAT     06/15/19 1006   06/15/19 1006  Culture, blood (x 2)  BLOOD CULTURE X 2,   STAT    Comments: INITIATE ANTIBIOTICS WITHIN 1 HOUR AFTER BLOOD CULTURES DRAWN.  If unable to obtain blood cultures, call MD immediately regarding antibiotic instructions.    06/15/19 1006   06/15/19 1006  Lactic acid, plasma  STAT Now then every 2 hours,   STAT     06/15/19 1006   06/15/19 0913  Urine culture  Once,   STAT  06/15/19 0912   06/15/19 0820  SARS CORONAVIRUS 2 (TAT 6-24 HRS) Nasopharyngeal Nasopharyngeal Swab  (Asymptomatic/Tier 3)  Once,   STAT    Question Answer Comment  Is this test for diagnosis or screening Screening   Symptomatic for COVID-19 as defined by CDC No   Hospitalized for COVID-19 No   Admitted to ICU for COVID-19 No   Previously tested for COVID-19 Yes   Resident in a congregate (group) care setting No   Employed in healthcare setting No   Pregnant No      06/15/19 0819   Signed and Held  Basic metabolic panel  Tomorrow morning,   R     Signed and Held   Signed and Held  CBC  Tomorrow morning,   R      Signed and Held   Signed and Held  AM - HgA1c  Tomorrow morning,   R     Signed and Held   Signed and Held  AM - FLP  Tomorrow morning,   R     Signed and Held   Signed and Held  Urine Drug Screen, Qualitative ([place] only)  Once,   R     Signed and Held          Vitals/Pain Today's Vitals   06/15/19 0930 06/15/19 1000 06/15/19 1035 06/15/19 1047  BP: 125/76 132/81 134/78   Pulse:  (!) 123 (!) 117 (!) 104  Resp: 17 (!) 22 20   Temp:   97.7 F (36.5 C)   TempSrc:   Oral   SpO2: 100% 100% 100% 100%  Weight:      Height:      PainSc:   0-No pain     Isolation Precautions No active isolations  Medications Medications  diltiazem (CARDIZEM) 1 mg/mL load via infusion 10 mg (10 mg Intravenous Bolus from Bag 06/15/19 0850)    And  diltiazem (CARDIZEM) 100 mg in dextrose 5 % 100 mL (1 mg/mL) infusion (15 mg/hr Intravenous Rate/Dose Change 06/15/19 1005)  magnesium sulfate IVPB 1 g 100 mL (1 g Intravenous New Bag/Given 06/15/19 1015)  0.9 %  sodium chloride infusion (has no administration in time range)  lactated ringers bolus 1,000 mL (0 mLs Intravenous Stopped 06/15/19 0850)  potassium chloride 20 MEQ/15ML (10%) solution 40 mEq (40 mEq Oral Given 06/15/19 0930)  potassium chloride SA (KLOR-CON) CR tablet 40 mEq (40 mEq Oral Given 06/15/19 1007)  sodium chloride 0.9 % bolus 1,000 mL (1,000 mLs Intravenous New Bag/Given 06/15/19 1016)    Mobility walks Low fall risk   Focused Assessments .   R Recommendations: See Admitting Provider Note  Report given to:   Additional Notes:  Pt had near syncopal episode this AM, HR 120-140 a fib. On cardizem drip currently. Initial lactic elevated, repeat WNL. Daughter at bedside. Continent. Walks with stand by assistance short distances.  Pt being treated for lymphoma, missed chemo today due to visit.

## 2019-06-15 NOTE — ED Notes (Signed)
Pt changed and peri care provided. Blanket given

## 2019-06-15 NOTE — Progress Notes (Signed)
*  PRELIMINARY RESULTS* Echocardiogram 2D Echocardiogram has been performed.  Lynita Groseclose C Gemayel Mascio 06/15/2019, 3:07 PM

## 2019-06-15 NOTE — ED Notes (Signed)
Attempted to call report X 1. 

## 2019-06-15 NOTE — ED Notes (Signed)
Pt given meal tray.

## 2019-06-15 NOTE — Telephone Encounter (Signed)
-----   Message from Rise Mu, PA-C sent at 06/15/2019  3:49 PM EST ----- Marykay Lex, can you get a real time Zio mailed to this lady. Dx: palpitations. Thanks!

## 2019-06-16 DIAGNOSIS — I5020 Unspecified systolic (congestive) heart failure: Secondary | ICD-10-CM | POA: Diagnosis not present

## 2019-06-16 LAB — CBC
HCT: 31.6 % — ABNORMAL LOW (ref 36.0–46.0)
Hemoglobin: 9.7 g/dL — ABNORMAL LOW (ref 12.0–15.0)
MCH: 29.4 pg (ref 26.0–34.0)
MCHC: 30.7 g/dL (ref 30.0–36.0)
MCV: 95.8 fL (ref 80.0–100.0)
Platelets: 246 10*3/uL (ref 150–400)
RBC: 3.3 MIL/uL — ABNORMAL LOW (ref 3.87–5.11)
RDW: 17.6 % — ABNORMAL HIGH (ref 11.5–15.5)
WBC: 12 10*3/uL — ABNORMAL HIGH (ref 4.0–10.5)
nRBC: 0 % (ref 0.0–0.2)

## 2019-06-16 LAB — BASIC METABOLIC PANEL
Anion gap: 8 (ref 5–15)
BUN: 8 mg/dL (ref 8–23)
CO2: 26 mmol/L (ref 22–32)
Calcium: 8.7 mg/dL — ABNORMAL LOW (ref 8.9–10.3)
Chloride: 109 mmol/L (ref 98–111)
Creatinine, Ser: 0.69 mg/dL (ref 0.44–1.00)
GFR calc Af Amer: >60 mL/min (ref >60)
GFR calc non Af Amer: >60 mL/min (ref >60)
Glucose, Bld: 95 mg/dL (ref 70–99)
Potassium: 3.5 mmol/L (ref 3.5–5.1)
Sodium: 143 mmol/L (ref 135–145)

## 2019-06-16 LAB — HEMOGLOBIN A1C
Hgb A1c MFr Bld: 4.9 % (ref 4.8–5.6)
Mean Plasma Glucose: 93.93 mg/dL

## 2019-06-16 LAB — TROPONIN I (HIGH SENSITIVITY): Troponin I (High Sensitivity): 93 ng/L — ABNORMAL HIGH (ref <18)

## 2019-06-16 LAB — LIPID PANEL
Cholesterol: 186 mg/dL (ref 0–200)
HDL: 62 mg/dL (ref >40)
LDL Cholesterol: 104 mg/dL — ABNORMAL HIGH (ref 0–99)
Total CHOL/HDL Ratio: 3 RATIO
Triglycerides: 99 mg/dL (ref <150)
VLDL: 20 mg/dL (ref 0–40)

## 2019-06-16 LAB — GLUCOSE, CAPILLARY: Glucose-Capillary: 78 mg/dL (ref 70–99)

## 2019-06-16 MED ORDER — METOPROLOL TARTRATE 50 MG PO TABS: 100.0000 mg | ORAL_TABLET | Freq: Two times a day (BID) | ORAL | Status: DC

## 2019-06-16 MED ORDER — CHLORHEXIDINE GLUCONATE CLOTH 2 % EX PADS
6.0000 | MEDICATED_PAD | Freq: Every day | CUTANEOUS | Status: DC
  Administered 2019-06-16 – 2019-06-20 (×5): 6 via TOPICAL

## 2019-06-16 MED ORDER — METOPROLOL TARTRATE 50 MG PO TABS
50.0000 mg | ORAL_TABLET | Freq: Once | ORAL | Status: DC
  Filled 2019-06-16: qty 1

## 2019-06-16 MED ORDER — SODIUM CHLORIDE 0.9 % IV BOLUS
250.0000 mL | Freq: Once | INTRAVENOUS | Status: AC
  Administered 2019-06-16: 21:00:00 250 mL via INTRAVENOUS

## 2019-06-16 MED ORDER — METOPROLOL SUCCINATE ER 100 MG PO TB24: 100.0000 mg | ORAL_TABLET | Freq: Every day | ORAL | Status: DC

## 2019-06-16 MED ORDER — SACUBITRIL-VALSARTAN 24-26 MG PO TABS
1.0000 | ORAL_TABLET | Freq: Two times a day (BID) | ORAL | Status: DC
  Administered 2019-06-16 – 2019-06-17 (×2): 1 via ORAL
  Filled 2019-06-16 (×5): qty 1

## 2019-06-16 MED ORDER — POTASSIUM CHLORIDE CRYS ER 20 MEQ PO TBCR
40.0000 meq | EXTENDED_RELEASE_TABLET | Freq: Once | ORAL | Status: AC
  Administered 2019-06-16: 40 meq via ORAL
  Filled 2019-06-16: qty 2

## 2019-06-16 MED ORDER — MAGNESIUM SULFATE 2 GM/50ML IV SOLN
2.0000 g | Freq: Once | INTRAVENOUS | Status: AC
  Administered 2019-06-16: 14:00:00 2 g via INTRAVENOUS
  Filled 2019-06-16: qty 50

## 2019-06-16 NOTE — ED Notes (Signed)
. ED TO INPATIENT HANDOFF REPORT  ED Nurse Name and Phone #: Deneise Lever [phone]  S Name/Age/Gender Christie Farmer 78 y.o. female Room/Bed: ED07A/ED07A  Code Status   Code Status: Full Code  Home/SNF/Other Home Patient oriented to: self, place, time and situation Is this baseline? Yes   Triage Complete: Triage complete  Chief Complaint near syncopal episode  Triage Note To ER via [facility] from home due to near syncope. Pt reporting generalized weakness. Denies pain. Pt tachycardic with EMS 120-130. CBG 142, other VS WNL. Pt receiving chemo for lymphoma. Pt alert and oriented X4, cooperative, RR even and unlabored, color WNL. Pt in NAD. EDP at bedside on arrival. No chest pain or SOB.   Allergies No Known Allergies  Level of Care/Admitting Diagnosis ED Disposition    ED Disposition Condition [facility] [[id number]]  Level of Care: Telemetry [5]  Covid Evaluation: Asymptomatic Screening Protocol (No Symptoms)  Diagnosis: Elevated troponin [id number]  Admitting Physician: Ivor Costa [[id number]  Attending Physician: Ivor Costa [phone]  Estimated length of stay: past midnight tomorrow  Certification:: I certify this patient will need inpatient services for at least 2 midnights  PT Class (Do Not Modify): Inpatient [101]  PT Acc Code (Do Not Modify): Private [1]       B Medical/Surgery History Past Medical History:  Diagnosis Date  . Cancer ([place])   . Hypertension    Past Surgical History:  Procedure Laterality Date  . ABDOMINAL HYSTERECTOMY       A IV Location/Drains/Wounds Patient Lines/Drains/Airways Status   Active Line/Drains/Airways    Name:   Placement date:   Placement time:   Site:   Days:   Implanted Port 06/15/19   06/15/19    1047    -   1   Peripheral IV 06/15/19 Right Antecubital   06/15/19    0750    Antecubital   1   Incision (Closed) 02/22/19 Neck Right   02/22/19    1830     114          Intake/Output  Last 24 hours No intake or output data in the 24 hours ending 06/16/19 1515  Labs/Imaging Results for orders placed or performed during the hospital encounter of 06/15/19 (from the past 48 hour(s))  Comprehensive metabolic panel     Status: Abnormal   Collection Time: 06/15/19  7:46 AM  Result Value Ref [place]   Sodium 143 135 - 145 mmol/L   Potassium 3.0 (L) 3.5 - 5.1 mmol/L   Chloride 104 98 - 111 mmol/L   CO2 28 22 - 32 mmol/L   Glucose, Bld 139 (H) 70 - 99 mg/dL   BUN 11 8 - 23 mg/dL   Creatinine, Ser 0.71 0.44 - 1.00 mg/dL   Calcium 9.4 8.9 - 10.3 mg/dL   Total Protein 6.0 (L) 6.5 - 8.1 g/dL   Albumin 3.6 3.5 - 5.0 g/dL   AST 38 15 - 41 U/L   ALT 22 0 - 44 U/L   Alkaline Phosphatase 64 38 - 126 U/L   Total Bilirubin 1.0 0.3 - 1.2 mg/dL   GFR calc non Af Amer >60 >60 mL/min   GFR calc Af Amer >60 >60 mL/min   Anion gap 11 5 - 15    Comment: Performed at [facility], [address], [place], [place] [address]  CBC with Differential     Status: Abnormal   Collection Time: 06/15/19  7:46  AM  Result Value Ref [place]   WBC 16.7 (H) 4.0 - 10.5 K/uL   RBC 3.85 (L) 3.87 - 5.11 MIL/uL   Hemoglobin 11.4 (L) 12.0 - 15.0 g/dL   HCT 35.0 (L) 36.0 - 46.0 %   MCV 90.9 80.0 - 100.0 fL   MCH 29.6 26.0 - 34.0 pg   MCHC 32.6 30.0 - 36.0 g/dL   RDW 17.3 (H) 11.5 - 15.5 %   Platelets 279 150 - 400 K/uL   nRBC 0.0 0.0 - 0.2 %   Neutrophils Relative % 68 %   Neutro Abs 11.3 (H) 1.7 - 7.7 K/uL   Lymphocytes Relative 16 %   Lymphs Abs 2.7 0.7 - 4.0 K/uL   Monocytes Relative 12 %   Monocytes Absolute 2.0 (H) 0.1 - 1.0 K/uL   Eosinophils Relative 0 %   Eosinophils Absolute 0.0 0.0 - 0.5 K/uL   Basophils Relative 1 %   Basophils Absolute 0.2 (H) 0.0 - 0.1 K/uL   Immature Granulocytes 3 %   Abs Immature Granulocytes 0.50 (H) 0.00 - 0.07 K/uL    Comment: Performed at [facility], [address] [place], [place] [address]  Magnesium     Status: None   Collection Time:  06/15/19  7:46 AM  Result Value Ref [place]   Magnesium 1.8 1.7 - 2.4 mg/dL    Comment: Performed at [facility], [place], [place] [address]  Troponin I (High Sensitivity)     Status: Abnormal   Collection Time: 06/15/19  7:46 AM  Result Value Ref [place]   Troponin I (High Sensitivity) 79 (H) <18 ng/L    Comment: (NOTE) Elevated high sensitivity troponin I (hsTnI) values and significant  changes across serial measurements may suggest ACS but many other  chronic and acute conditions are known to elevate hsTnI results.  Refer to the "Links" section for chest pain algorithms and additional  guidance. Performed at [facility], [address] [place], [place] [address]   Lactic acid, plasma     Status: Abnormal   Collection Time: 06/15/19  7:46 AM  Result Value Ref [place]   Lactic Acid, Venous 2.3 (HH) 0.5 - 1.9 mmol/L    Comment: CRITICAL RESULT CALLED TO, READ BACK BY AND VERIFIED WITH ALLEY YAW ON 06/15/19 AT 0829 JJB Performed at [facility], [place] [place], [place] [address]   TSH     Status: None   Collection Time: 06/15/19  7:46 AM  Result Value Ref [place]   TSH 3.241 0.350 - 4.500 uIU/mL    Comment: Performed by a 3rd Generation assay with a functional sensitivity of <=0.01 uIU/mL. Performed at [facility], [address] [place], [place] [address]   T4, free     Status: Abnormal   Collection Time: 06/15/19  7:46 AM  Result Value Ref [place]   Free T4 1.31 (H) 0.61 - 1.12 ng/dL    Comment: (NOTE) Biotin ingestion may interfere with free T4 tests. If the results are inconsistent with the TSH level, previous test results, or the clinical presentation, then consider biotin interference. If needed, order repeat testing after stopping biotin. Performed at [facility], [address] [place],  [address]   Culture, blood (x 2)     Status: None (Preliminary result)   Collection Time: 06/15/19   7:46 AM   Specimen: BLOOD  Result Value Ref [place]   Specimen Description BLOOD RAC    Special  Requests      BOTTLES DRAWN AEROBIC AND ANAEROBIC Blood Culture adequate volume   Culture      NO GROWTH < 24 HOURS Performed at [facility], [place], [place] [address]    Report Status PENDING   BNP     Status: Abnormal   Collection Time: 06/15/19  7:46 AM  Result Value Ref [place]   B Natriuretic Peptide 455.0 (H) 0.0 - 100.0 pg/mL    Comment: Performed at [facility], [place], [place] [address]  SARS CORONAVIRUS 2 (TAT 6-24 HRS) Nasopharyngeal Nasopharyngeal Swab     Status: None   Collection Time: 06/15/19  8:30 AM   Specimen: Nasopharyngeal Swab  Result Value Ref [place]   SARS Coronavirus 2 NEGATIVE NEGATIVE    Comment: (NOTE) SARS-CoV-2 target nucleic acids are NOT DETECTED. The SARS-CoV-2 RNA is generally detectable in upper and lower respiratory specimens during the acute phase of infection. Negative results do not preclude SARS-CoV-2 infection, do not rule out co-infections with other pathogens, and should not be used as the sole basis for treatment or other patient management decisions. Negative results must be combined with clinical observations, patient history, and epidemiological information. The expected result is Negative. Fact Sheet for Patients: SugarRoll.be Fact Sheet for Healthcare Providers: https://www.woods-mathews.com/ This test is not yet approved or cleared by the [place] FDA and  has been authorized for detection and/or diagnosis of SARS-CoV-2 by FDA under an Emergency Use Authorization (EUA). This EUA will remain  in effect (meaning this test can be used) for the duration of the COVID-19 declaration under Section 56 4(b)(1) of the Act, 21 U.S.C. section 360bbb-3(b)(1), unless the authorization is terminated or revoked sooner. Performed at [facility], [place] [address], [place], [place] [address]   Urinalysis, Complete w Microscopic     Status: Abnormal   Collection Time: 06/15/19  8:55 AM  Result Value Ref [place]   Color, Urine YELLOW (A) YELLOW   APPearance CLEAR (A) CLEAR   Specific Gravity, Urine 1.006 1.005 - 1.030   pH 6.0 5.0 - 8.0   Glucose, UA NEGATIVE NEGATIVE mg/dL   Hgb urine dipstick NEGATIVE NEGATIVE   Bilirubin Urine NEGATIVE NEGATIVE   Ketones, ur 5 (A) NEGATIVE mg/dL   Protein, ur NEGATIVE NEGATIVE mg/dL   Nitrite NEGATIVE NEGATIVE   Leukocytes,Ua SMALL (A) NEGATIVE   RBC / HPF 0-5 0 - 5 RBC/hpf   WBC, UA 11-20 0 - 5 WBC/hpf   Bacteria, UA RARE (A) NONE SEEN   Squamous Epithelial / LPF 0-5 0 - 5   Mucus PRESENT     Comment: Performed at [facility], [address] [place],  [address]  Urine Drug Screen, Qualitative ([facility] only)     Status: Abnormal   Collection Time: 06/15/19  8:55 AM  Result Value Ref [place]   Tricyclic, Ur Screen NONE DETECTED NONE DETECTED   Amphetamines, Ur Screen NONE DETECTED NONE DETECTED   MDMA (Ecstasy)Ur Screen NONE DETECTED NONE DETECTED   Cocaine Metabolite,Ur [place] NONE DETECTED NONE DETECTED   Opiate, Ur Screen NONE DETECTED NONE DETECTED   Phencyclidine (PCP) Ur S NONE DETECTED NONE DETECTED   Cannabinoid 50 Ng, Ur [place] POSITIVE (A) NONE DETECTED   Barbiturates, Ur Screen NONE DETECTED NONE DETECTED   Benzodiazepine, Ur Scrn NONE DETECTED NONE DETECTED   Methadone Scn, Ur NONE DETECTED NONE DETECTED    Comment: (NOTE) Tricyclics + metabolites, urine  Cutoff 1000 ng/mL Amphetamines + metabolites, urine  Cutoff 1000 ng/mL MDMA (Ecstasy), urine              Cutoff 500 ng/mL Cocaine Metabolite, urine          Cutoff 300 ng/mL Opiate + metabolites, urine        Cutoff 300 ng/mL Phencyclidine (PCP), urine         Cutoff 25 ng/mL Cannabinoid, urine                 Cutoff 50 ng/mL Barbiturates + metabolites, urine  Cutoff 200 ng/mL Benzodiazepine, urine               Cutoff 200 ng/mL Methadone, urine                   Cutoff 300 ng/mL The urine drug screen provides only a preliminary, unconfirmed analytical test result and should not be used for non-medical purposes. Clinical consideration and professional judgment should be applied to any positive drug screen result due to possible interfering substances. A more specific alternate chemical method must be used in order to obtain a confirmed analytical result. Gas chromatography / mass spectrometry (GC/MS) is the preferred confirmat ory method. Performed at [facility], [place], [place] [address]   Troponin I (High Sensitivity)     Status: Abnormal   Collection Time: 06/15/19 10:06 AM  Result Value Ref [place]   Troponin I (High Sensitivity) 84 (H) <18 ng/L    Comment: (NOTE) Elevated high sensitivity troponin I (hsTnI) values and significant  changes across serial measurements may suggest ACS but many other  chronic and acute conditions are known to elevate hsTnI results.  Refer to the "Links" section for chest pain algorithms and additional  guidance. Performed at [facility], [address] [place], [place] [address]   Lactic acid, plasma     Status: None   Collection Time: 06/15/19 10:06 AM  Result Value Ref [place]   Lactic Acid, Venous 1.7 0.5 - 1.9 mmol/L    Comment: Performed at [facility], [address] [place], [place] [address]  Procalcitonin     Status: None   Collection Time: 06/15/19 11:01 AM  Result Value Ref [place]   Procalcitonin 0.23 ng/mL    Comment:        Interpretation: PCT (Procalcitonin) <= 0.5 ng/mL: Systemic infection (sepsis) is not likely. Local bacterial infection is possible. (NOTE)       Sepsis PCT Algorithm           Lower Respiratory Tract                                      Infection PCT Algorithm    ----------------------------     ----------------------------         PCT < 0.25 ng/mL                PCT  < 0.10 ng/mL         Strongly encourage             Strongly discourage   discontinuation of antibiotics    initiation of antibiotics    ----------------------------     -----------------------------       PCT 0.25 - 0.50 ng/mL            PCT 0.10 - 0.25 ng/mL  OR       >80% decrease in PCT            Discourage initiation of                                            antibiotics      Encourage discontinuation           of antibiotics    ----------------------------     -----------------------------         PCT >= 0.50 ng/mL              PCT 0.26 - 0.50 ng/mL               AND        <80% decrease in PCT             Encourage initiation of                                             antibiotics       Encourage continuation           of antibiotics    ----------------------------     -----------------------------        PCT >= 0.50 ng/mL                  PCT > 0.50 ng/mL               AND         increase in PCT                  Strongly encourage                                      initiation of antibiotics    Strongly encourage escalation           of antibiotics                                     -----------------------------                                           PCT <= 0.25 ng/mL                                                 OR                                        > 80% decrease in PCT                                     Discontinue / Do not initiate                                               antibiotics Performed at [facility], [address] [place], [place] [address]   Culture, blood (x 2)     Status: None (Preliminary result)   Collection Time: 06/15/19 11:13 AM   Specimen: BLOOD  Result Value Ref Range   Specimen Description BLOOD BLH    Special Requests      BOTTLES DRAWN AEROBIC AND ANAEROBIC Blood Culture results may not be optimal due to an inadequate volume of blood received in culture bottles   Culture      NO GROWTH < 24  HOURS Performed at [facility], [address], [place], [place] [address]    Report Status PENDING   Troponin I (High Sensitivity)     Status: Abnormal   Collection Time: 06/15/19  1:17 PM  Result Value Ref Range   Troponin I (High Sensitivity) 86 (H) <18 ng/L    Comment: (NOTE) Elevated high sensitivity troponin I (hsTnI) values and significant  changes across serial measurements may suggest ACS but many other  chronic and acute conditions are known to elevate hsTnI results.  Refer to the "Links" section for chest pain algorithms and additional  guidance. Performed at [facility], [address] [place], [place] [id number]   Basic metabolic panel     Status: Abnormal   Collection Time: 06/16/19  6:38 AM  Result Value Ref [place]   Sodium 143 135 - 145 mmol/L   Potassium 3.5 3.5 - 5.1 mmol/L   Chloride 109 98 - 111 mmol/L   CO2 26 22 - 32 mmol/L   Glucose, Bld 95 70 - 99 mg/dL   BUN 8 8 - 23 mg/dL   Creatinine, Ser 0.69 0.44 - 1.00 mg/dL   Calcium 8.7 (L) 8.9 - 10.3 mg/dL   GFR calc non Af Amer >60 >60 mL/min   GFR calc Af Amer >60 >60 mL/min   Anion gap 8 5 - 15    Comment: Performed at [facility], [address] [place], [place] [address]  CBC     Status: Abnormal   Collection Time: 06/16/19  6:38 AM  Result Value Ref [place]   WBC 12.0 (H) 4.0 - 10.5 K/uL   RBC 3.30 (L) 3.87 - 5.11 MIL/uL   Hemoglobin 9.7 (L) 12.0 - 15.0 g/dL   HCT 31.6 (L) 36.0 - 46.0 %   MCV 95.8 80.0 - 100.0 fL   MCH 29.4 26.0 - 34.0 pg   MCHC 30.7 30.0 - 36.0 g/dL   RDW 17.6 (H) 11.5 - 15.5 %   Platelets 246 150 - 400 K/uL   nRBC 0.0 0.0 - 0.2 %    Comment: Performed at [facility], [address] [place], [place] [address]  AM - FLP     Status: Abnormal   Collection Time: 06/16/19  6:38 AM  Result Value Ref [place]   Cholesterol 186 0 - 200 mg/dL   Triglycerides 99 <150 mg/dL   HDL 62 >40 mg/dL   Total CHOL/HDL Ratio 3.0 RATIO   VLDL 20 0 - 40  mg/dL   LDL Cholesterol 104 (H) 0 - 99 mg/dL    Comment:        Total Cholesterol/HDL:CHD Risk Coronary Heart Disease Risk Table                     Men   Women  1/2 Average Risk   3.4   3.3  Average Risk       5.0  4.4  2 X Average Risk   9.6   7.1  3 X Average Risk  23.4   11.0        Use the calculated Patient Ratio above and the CHD Risk Table to determine the patient's CHD Risk.        ATP III CLASSIFICATION (LDL):  <100     mg/dL   Optimal  100-129  mg/dL   Near or Above                    Optimal  130-159  mg/dL   Borderline  160-189  mg/dL   High  >190     mg/dL   Very High Performed at [facility], [address] [place], [place] [address]   Glucose, capillary     Status: None   Collection Time: 06/16/19  9:31 AM  Result Value Ref Range   Glucose-Capillary 78 70 - 99 mg/dL  Troponin I (High Sensitivity)     Status: Abnormal   Collection Time: 06/16/19  2:12 PM  Result Value Ref [place]   Troponin I (High Sensitivity) 93 (H) <18 ng/L    Comment: (NOTE) Elevated high sensitivity troponin I (hsTnI) values and significant  changes across serial measurements may suggest ACS but many other  chronic and acute conditions are known to elevate hsTnI results.  Refer to the "Links" section for chest pain algorithms and additional  guidance. Performed at [facility], [address] [place], [place] [address]    Dg Knee 2 Views Left  Result Date: 06/15/2019 CLINICAL DATA:  Left knee pain EXAM: LEFT KNEE - 1-2 VIEW COMPARISON:  None. FINDINGS: No acute fracture or dislocation or joint effusion is identified. Narrow femoral tibial joint space with osteophyte formation are noted. Chondrocalcinosis is identified in the femoral tibial joint. IMPRESSION: No acute fracture or dislocation. Osteoarthritic changes of left knee. Electronically Signed   By: Abelardo Diesel M.D.   On: 06/15/2019 09:05   Dg Chest Portable 1 View  Result Date: 06/15/2019 CLINICAL  DATA:  Generalized weakness EXAM: PORTABLE CHEST 1 VIEW COMPARISON:  None. FINDINGS: Right chest wall port catheter tip is near the cavoatrial junction. Low lung volumes. No focal consolidation or edema. No pleural effusion or pneumothorax. Normal heart size. Calcification is present along the aortic arch. IMPRESSION: No acute process in the chest. Electronically Signed   By: Macy Mis M.D.   On: 06/15/2019 08:16    Pending Labs Unresulted Labs (From admission, onward)    Start     Ordered   06/17/19 [id number]  Basic metabolic panel  Tomorrow morning,   STAT     06/16/19 1333   06/17/19 0500  Magnesium  Tomorrow morning,   STAT     06/16/19 1333   06/16/19 0500  AM - HgA1c  Tomorrow morning,   STAT     06/15/19 1324   06/15/19 0913  Urine culture  Once,   STAT     06/15/19 0912          Vitals/Pain Today's Vitals   06/16/19 1115 06/16/19 1130 06/16/19 1145 06/16/19 1200  BP:    122/82  Pulse: 94 93 88 (!) 102  Resp:    (!) 9  Temp:      TempSrc:      SpO2: 100% 100% 100% 100%  Weight:      Height:      PainSc:        Isolation Precautions  No active isolations  Medications Medications  traMADol (ULTRAM) tablet 50 mg (50 mg Oral Given 06/16/19 0826)  polyethylene glycol (MIRALAX / GLYCOLAX) packet 17 g (has no administration in time range)  feeding supplement (ENSURE ENLIVE) (ENSURE ENLIVE) liquid 237 mL (237 mLs Oral Refused 06/16/19 1406)  multivitamin with minerals tablet 1 tablet (1 tablet Oral Given 06/16/19 0825)  loratadine (CLARITIN) tablet 10 mg (10 mg Oral Given 06/16/19 0826)  heparin injection 5,000 Units (5,000 Units Subcutaneous Given 06/16/19 1416)  acetaminophen (TYLENOL) tablet 650 mg (has no administration in time range)    Or  acetaminophen (TYLENOL) suppository 650 mg (has no administration in time range)  ondansetron (ZOFRAN) tablet 4 mg (has no administration in time range)    Or  ondansetron (ZOFRAN) injection 4 mg (has no administration in time range)   hydrALAZINE (APRESOLINE) tablet 25 mg (has no administration in time range)  0.9 %  sodium chloride infusion ( Intravenous New Bag/Given 06/15/19 1608)  aspirin EC tablet 81 mg (81 mg Oral Given 06/16/19 0826)  sacubitril-valsartan (ENTRESTO) 24-26 mg per tablet (1 tablet Oral Given 06/16/19 0953)  magnesium sulfate IVPB 2 g 50 mL (2 g Intravenous New Bag/Given 06/16/19 1415)  potassium chloride SA (KLOR-CON) CR tablet 40 mEq (has no administration in time range)  metoprolol tartrate (LOPRESSOR) tablet 50 mg (has no administration in time range)  metoprolol succinate (TOPROL-XL) 24 hr tablet 100 mg (has no administration in time range)  lactated ringers bolus 1,000 mL (0 mLs Intravenous Stopped 06/15/19 0850)  diltiazem (CARDIZEM) 1 mg/mL load via infusion 10 mg (10 mg Intravenous Not Given 06/15/19 1551)  potassium chloride 20 MEQ/15ML (10%) solution 40 mEq (40 mEq Oral Given 06/15/19 0930)  potassium chloride SA (KLOR-CON) CR tablet 40 mEq (40 mEq Oral Given 06/15/19 1007)  magnesium sulfate IVPB 1 g 100 mL (0 g Intravenous Stopped 06/15/19 1126)  sodium chloride 0.9 % bolus 1,000 mL (0 mLs Intravenous Stopped 06/15/19 1127)    Mobility non-ambulatory Moderate fall risk   Focused Assessments Cardiac Assessment Handoff:  Cardiac Rhythm: Normal sinus rhythm No results found for: CKTOTAL, CKMB, CKMBINDEX, TROPONINI No results found for: DDIMER Does the Patient currently have chest pain? No      R Recommendations: See Admitting Provider Note  Report given to:   Additional Notes:

## 2019-06-16 NOTE — ED Notes (Signed)
This RN attempted to call report at this time.  

## 2019-06-16 NOTE — TOC Initial Note (Signed)
Transition of Care [facility]) - Initial/Assessment Note    Patient Details  Name: Christie Farmer MRN: [id number] Date of Birth: 1940/12/08  [facility] CM/SW Contact:    Katrina Stack, RN Phone Number: 06/16/2019, 1:28 PM  Clinical Narrative:                  Screened in ED due to high risk score. Patient is currently followed by [facility] oncology- Dr Jessee Avers. Was to have received her last chemo treatment yesterday.She doe not currently have a pcp as until her cancer, she was in excellent health per daughter and no need for a pcp. Independent in all adls, denies issues accessing medical care, obtaining medications or with transportation. Since her cancer diagnosis and chemo, she does experience weakness and lethargy. Admitted with chest pain and syncope.  Patient Goals and CMS Choice        Expected Discharge Plan and Services                                                Prior Living Arrangements/Services                       Activities of Daily Living      Permission Sought/Granted                  Emotional Assessment              Admission diagnosis:  near syncopal episode Patient Active Problem List   Diagnosis Date Noted  . Elevated troponin 06/15/2019  . Hypokalemia 06/15/2019  . Lactic acidosis 06/15/2019  . Sinus tachycardia 06/15/2019  . Near syncope 06/15/2019  . Hypertension   . Lymphoma ([place])   . ARF (acute renal failure) ([place]) 03/04/2019  . Generalized weakness 03/02/2019   PCP:  Patient, No Pcp Per Pharmacy:   [facility] [id number], [place] [address] Phone: [phone] Fax: [phone]     Social Determinants of Health (SDOH) Interventions    Readmission Risk Interventions No flowsheet data found.

## 2019-06-16 NOTE — Progress Notes (Signed)
PROGRESS NOTE    Christie Farmer  [id number] DOB: 1940/10/09 DOA: 06/15/2019 PCP: Patient, No Pcp Per  Brief Narrative:  Per HPI: Christie Farmer is a 78 y.o. 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  Pt states that she is currently doing chemotherapy in [facility], which causes generalized weakness.  She also has lightheadedness and dizziness sometimes.  In several situation, she almost passed out, but did not.  She states that she fell a week ago with left knee injury, but strongly denies head or neck injury.  She refused my offer of CT scan of head and neck. She was found to have tachycardia with heart rate up to 140s in ED. She had heart racing feeling in the early morning, which has resolved. Patient denies chest pain, shortness of breath.  No cough, fever or chills.  Patient does not have nausea, vomiting, diarrhea, abdominal pain, symptoms of UTI.  No unilateral weakness or numbness in extremities.  No facial droop or slurred speech.  12/5: No acute status changes.  Patient's main complaint is pain in her left leg.  Continues to endorse some weakness.  Blood pressure heart rate remained mildly elevated.  Echocardiogram pending.   Assessment & Plan:   Principal Problem:   Elevated troponin Active Problems:   Hypertension   Lymphoma (HCC)   Hypokalemia   Lactic acidosis   Sinus tachycardia   Near syncope  Sinus tachycardia with frequent PACs: initially suspected A fib with RVR. Card was consulted, per Dr. Oley Balm, pt does not have atrial fibrillation, patient most likely to have sinus tachycardia with frequent PAC, though SVT cannot be completely ruled out.  Dr. Rockey Situ will Zio monitor as outpatient  -Diltiazem drip weaned off -Increase metoprolol tartrate 200 mg twice daily, plan to transition to succinate on discharge -2D echocardiogram, results pending -Outpatient follow-up with cardiology   Elevated troponin: no CP.  Possible demand ischemia. -Troponin peaked at 86 on 06/15/2019, recheck today - f/u 2d echo - continue ASA -check A1c and FLp  Near syncope: Likely multifactorial etiology, including tachycardia, dehydration, ongoing chemotherapy which is causing weakness. -pt/OT -pt refused CT-head -IVF  Hypokalemia: K=3.3  on admission. Mg 1.8 Replete potassium and magnesium as needed  Hypertension: Metoprolol tartrate 100 mg p.o. twice daily As needed hydralazine  Lymphoma ([place]): on chemo currently -f/u in [facility] with oncology  Lactic acidosis: Lactic acid 2.3, which is resolved --> 1.7 with IVF. No fever. No signs of infection.  Low suspicions for sepsis.  Possibly due to dehydration -IV fluid: 1 L normal saline, followed by 75 cc/h    DVT prophylaxis: Subcu heparin Code Status: Full Family Communication: None today  disposition Plan: Anticipate home within 24-48 hours   Consultants:   Cardiology  Procedures:   None   Antimicrobials:   None    Subjective: Seen and examined No acute interval events No new complaints  Objective: Vitals:   06/16/19 0600 06/16/19 0700 06/16/19 0800 06/16/19 0825  BP: (!) 144/84 (!) 150/85 (!) 154/104 (!) 154/104  Pulse: 100 (!) 102 (!) 114 99  Resp: 14 (!) 6 15   Temp:      TempSrc:      SpO2: 100% 100% 100%   Weight:      Height:        Intake/Output Summary (Last 24 hours) at 06/16/2019 1326 Last data filed at 06/15/2019 1429 Gross per 24 hour  Intake 50 ml  Output -  Net 50 ml   Filed Weights   06/15/19 0742  Weight: 72.6 kg    Examination: General: Not in acute distress.  Dry mucus membrane, appears stated HEENT:       Eyes: PERRL, EOMI, no scleral icterus.       ENT: No discharge from the ears and nose, no pharynx injection, no tonsillar enlargement.        Neck: No JVD, no bruit, no mass felt. Heme: No neck lymph node enlargement. Cardiac: S1/S2,  tachycardic, regular rhythm, no  murmurs, No gallops or rubs. Respiratory:  No rales, wheezing, rhonchi or rubs. GI: Soft, nondistended, nontender, no rebound pain, no organomegaly, BS present. GU: No hematuria Ext: No pitting leg edema bilaterally. 2+DP/PT pulse bilaterally. Musculoskeletal: No joint deformities, No joint redness or warmth, no limitation of ROM in spin. Skin: No rashes.  Neuro: Alert, oriented X3, cranial nerves II-XII grossly intact, moves all extremities normally. Psych: Patient is not psychotic, no suicidal or hemocidal ideation.  Labs on Admission: I have personally reviewed following labs and imaging studies     Data Reviewed: I have personally reviewed following labs and imaging studies  CBC: Recent Labs  Lab 06/15/19 0746 06/16/19 0638  WBC 16.7* 12.0*  NEUTROABS 11.3*  --   HGB 11.4* 9.7*  HCT 35.0* 31.6*  MCV 90.9 95.8  PLT 279 0000000   Basic Metabolic Panel: Recent Labs  Lab 06/15/19 0746 06/16/19 0638  NA 143 143  K 3.0* 3.5  CL 104 109  CO2 28 26  GLUCOSE 139* 95  BUN 11 8  CREATININE 0.71 0.69  CALCIUM 9.4 8.7*  MG 1.8  --    GFR: Estimated Creatinine Clearance: 56.6 mL/min (by C-G formula based on SCr of 0.69 mg/dL). Liver Function Tests: Recent Labs  Lab 06/15/19 0746  AST 38  ALT 22  ALKPHOS 64  BILITOT 1.0  PROT 6.0*  ALBUMIN 3.6   No results for input(s): LIPASE, AMYLASE in the last 168 hours. No results for input(s): AMMONIA in the last 168 hours. Coagulation Profile: No results for input(s): INR, PROTIME in the last 168 hours. Cardiac Enzymes: No results for input(s): CKTOTAL, CKMB, CKMBINDEX, TROPONINI in the last 168 hours. BNP (last 3 results) No results for input(s): PROBNP in the last 8760 hours. HbA1C: No results for input(s): HGBA1C in the last 72 hours. CBG: Recent Labs  Lab 06/16/19 0931  GLUCAP 78   Lipid Profile: Recent Labs    06/16/19 0638  CHOL 186  HDL 62  LDLCALC 104*  TRIG 99  CHOLHDL 3.0   Thyroid Function Tests:  Recent Labs    06/15/19 0746  TSH 3.241  FREET4 1.31*   Anemia Panel: No results for input(s): VITAMINB12, FOLATE, FERRITIN, TIBC, IRON, RETICCTPCT in the last 72 hours. Sepsis Labs: Recent Labs  Lab 06/15/19 0746 06/15/19 1006 06/15/19 1101  PROCALCITON  --   --  0.23  LATICACIDVEN 2.3* 1.7  --     Recent Results (from the past 240 hour(s))  Culture, blood (x 2)     Status: None (Preliminary result)   Collection Time: 06/15/19  7:46 AM   Specimen: BLOOD  Result Value Ref Range Status   Specimen Description BLOOD RAC  Final   Special Requests   Final    BOTTLES DRAWN AEROBIC AND ANAEROBIC Blood Culture adequate volume   Culture   Final    NO GROWTH < 24 HOURS Performed at [facility], [address] [place], [place]  [address]    Report Status PENDING  Incomplete  SARS CORONAVIRUS 2 (TAT 6-24 HRS) Nasopharyngeal Nasopharyngeal Swab     Status: None   Collection Time: 06/15/19  8:30 AM   Specimen: Nasopharyngeal Swab  Result Value Ref Range Status   SARS Coronavirus 2 NEGATIVE NEGATIVE Final    Comment: (NOTE) SARS-CoV-2 target nucleic acids are NOT DETECTED. The SARS-CoV-2 RNA is generally detectable in upper and lower respiratory specimens during the acute phase of infection. Negative results do not preclude SARS-CoV-2 infection, do not rule out co-infections with other pathogens, and should not be used as the sole basis for treatment or other patient management decisions. Negative results must be combined with clinical observations, patient history, and epidemiological information. The expected result is Negative. Fact Sheet for Patients: SugarRoll.be Fact Sheet for Healthcare Providers: https://www.woods-mathews.com/ This test is not yet approved or cleared by the [place] FDA and  has been authorized for detection and/or diagnosis of SARS-CoV-2 by FDA under an Emergency Use Authorization (EUA). This EUA  will remain  in effect (meaning this test can be used) for the duration of the COVID-19 declaration under Section 56 4(b)(1) of the Act, 21 U.S.C. section 360bbb-3(b)(1), unless the authorization is terminated or revoked sooner. Performed at [facility], [place] [address] [place], [place] [address]   Culture, blood (x 2)     Status: None (Preliminary result)   Collection Time: 06/15/19 11:13 AM   Specimen: BLOOD  Result Value Ref Range Status   Specimen Description BLOOD [facility]  Final   Special Requests   Final    BOTTLES DRAWN AEROBIC AND ANAEROBIC Blood Culture results may not be optimal due to an inadequate volume of blood received in culture bottles   Culture   Final    NO GROWTH < 24 HOURS Performed at [facility], [address] [place], [place] [address]    Report Status PENDING  Incomplete         Radiology Studies: Dg Knee 2 Views Left  Result Date: 06/15/2019 CLINICAL DATA:  Left knee pain EXAM: LEFT KNEE - 1-2 VIEW COMPARISON:  None. FINDINGS: No acute fracture or dislocation or joint effusion is identified. Narrow femoral tibial joint space with osteophyte formation are noted. Chondrocalcinosis is identified in the femoral tibial joint. IMPRESSION: No acute fracture or dislocation. Osteoarthritic changes of left knee. Electronically Signed   By: Abelardo Diesel M.D.   On: 06/15/2019 09:05   Dg Chest Portable 1 View  Result Date: 06/15/2019 CLINICAL DATA:  Generalized weakness EXAM: PORTABLE CHEST 1 VIEW COMPARISON:  None. FINDINGS: Right chest wall port catheter tip is near the cavoatrial junction. Low lung volumes. No focal consolidation or edema. No pleural effusion or pneumothorax. Normal heart size. Calcification is present along the aortic arch. IMPRESSION: No acute process in the chest. Electronically Signed   By: Macy Mis M.D.   On: 06/15/2019 08:16        Scheduled Meds: . aspirin EC  81 mg Oral Daily  . feeding supplement (ENSURE  ENLIVE)  237 mL Oral BID BM  . heparin  5,000 Units Subcutaneous Q8H  . loratadine  10 mg Oral Daily  . metoprolol tartrate  50 mg Oral BID  . multivitamin with minerals  1 tablet Oral Daily  . sacubitril-valsartan  1 tablet Oral BID   Continuous Infusions: . sodium chloride 75 mL/hr at 06/15/19 1608     LOS: 1 day    Time spent: 35 minutes  Sidney Ace, MD [facility] Pager [phone]  If 7PM-7AM, please contact night-coverage [email or web address] Password [email or web address] 06/16/2019, 1:26 PM

## 2019-06-16 NOTE — Evaluation (Addendum)
Occupational Therapy Evaluation Patient Details Name: Christie Farmer MRN: [id number] DOB: [birth year] Today's Date: 06/16/2019    History of Present Illness Pt is 78 y/o F who presented to ER secondary to progressive weakness, lightheadedness and dizziness; admitted for management of near syncope, sinus tachycardia with frequent PACs.  Noted with mild elevation in troponin, likely demand ischemia per notes.   Clinical Impression   Pt seen for OT evaluation this date. Prior to hospital admission, pt was requiring very little assist from her dtr for bathing and dressing, and in most recent weeks has been requiring assist toileting as well.  Pt lives with dtr and son-in-law in two story home in which she resides on the first level.  Currently pt demonstrates impairments in fxl activity tolerance and strength requiring MIN A with fxl ADL transfers and MOD A with LB ADLs.  Pt would benefit from skilled OT to address noted impairments and functional limitations (see below for any additional details) in order to maximize safety and independence while minimizing falls risk and caregiver burden.  Upon hospital discharge, recommend pt discharge to Home with HHOT and supv for when performing standing self care or ADL transfers    Follow Up Recommendations  Home health OT;Supervision - Intermittent(supv for standing ADLs/ADL transfers.)    Equipment Recommendations  3 in 1 bedside commode    Recommendations for Other Services       Precautions / Restrictions Precautions Precautions: Fall Precaution Comments: R chest port Restrictions Weight Bearing Restrictions: No Other Position/Activity Restrictions: monitor BP, taken supine at 129/87, in seated EOB 102/64. Does c/o slight dizziness in sitting but states it's getting better      Mobility Bed Mobility Overal bed mobility: Needs Assistance Bed Mobility: Supine to Sit;Sit to Supine     Supine to sit: Min assist Sit to supine: Min assist    General bed mobility comments: MIN A to manage LEs on one occasion, pt performs with CGA on second trial.  Transfers Overall transfer level: Needs assistance   Transfers: Sit to/from Stand Sit to Stand: Min assist         General transfer comment: assist for lift off and stabilization once upright, pt unable to tolerate static stand long enough to check orthostatic BP, does endorse some diziness, but mostly requests to sit d/t fatigue.    Balance Overall balance assessment: Needs assistance Sitting-balance support: No upper extremity supported;Feet supported Sitting balance-Leahy Scale: Fair     Standing balance support: Bilateral upper extremity supported Standing balance-Leahy Scale: Poor Standing balance comment: requires B UE support and MIN A                           ADL either performed or assessed with clinical judgement   ADL Overall ADL's : Needs assistance/impaired Eating/Feeding: Set up;Sitting   Grooming: Wash/dry face;Oral care;Set up;Sitting   Upper Body Bathing: Minimal assistance;Sitting   Lower Body Bathing: Moderate assistance;Sit to/from stand   Upper Body Dressing : Minimal assistance;Sitting   Lower Body Dressing: Moderate assistance;Sit to/from stand   Toilet Transfer: Minimal assistance;Stand-pivot;BSC   Toileting- Clothing Manipulation and Hygiene: Minimal assistance;Sit to/from Nurse, children's Details (indicate cue type and reason): did not assess on eval         Vision Patient Visual Report: No change from baseline       Perception     Praxis      Pertinent Vitals/Pain Pain Assessment: Faces  Faces Pain Scale: Hurts little more Pain Location: L LE and bottom which she attributes to laying on ER stretcher. Pain Descriptors / Indicators: Aching;Sore Pain Intervention(s): Limited activity within patient's tolerance;Monitored during session;Repositioned     Hand Dominance     Extremity/Trunk  Assessment Upper Extremity Assessment Upper Extremity Assessment: Generalized weakness L grip 3+/5, R grip 4-/5, L UE overall < R UE   Lower Extremity Assessment Lower Extremity Assessment: Generalized weakness       Communication Communication Communication: No difficulties   Cognition Arousal/Alertness: Awake/alert Behavior During Therapy: WFL for tasks assessed/performed Overall Cognitive Status: Within Functional Limits for tasks assessed                                 General Comments: some slow processing, extended time to respond, but overall appropriate. Some impulsivity, attempts to stand before OT is ready to assist.   General Comments       Exercises Other Exercises Other Exercises: OT facilitates education re: role of OT in acute setting as well as general safety and fall prevention including asking for help prior to mobilizing. Pt verbalized understanding, but may require some gentle reinforcement. Dtr demos good understanding.   Shoulder Instructions      Home Living Family/patient expects to be discharged to:: Private residence Living Arrangements: Children Available Help at Discharge: Family;Available 24 hours/day Type of Home: House Home Access: Stairs to enter [employer] of Steps: 4-5 Entrance Stairs-Rails: None Home Layout: Two level;Able to live on main level with bedroom/bathroom               Home Equipment: Walker - 2 wheels          Prior Functioning/Environment          Comments: Pt states she is independent with most ADLs, but requires some assist from daughter with LB dressing and sponge bathes d/t chemo making water temperature less tolerable. In addition, in most recent weeks, pt needing some help to stabilize with toileting, but dtr reports this is not typical. Pt has used RW since hospitalization 02/2019. Pt with 2-3 falls in past 6 months. Was recieveing HHPT.        OT Problem List: Decreased  strength;Decreased activity tolerance;Impaired balance (sitting and/or standing);Decreased safety awareness;Cardiopulmonary status limiting activity;Pain      OT Treatment/Interventions: Self-care/ADL training;Therapeutic exercise;Energy conservation;DME and/or AE instruction;Therapeutic activities;Patient/family education;Balance training    OT Goals(Current goals can be found in the care plan section) Acute Rehab OT Goals Patient Stated Goal: to get my strength back OT Goal Formulation: With patient/family Time For Goal Achievement: 06/30/19 Potential to Achieve Goals: Good  OT Frequency: Min 1X/week   Barriers to D/C:            Co-evaluation              AM-PAC OT "6 Clicks" Daily Activity     Outcome Measure Help from another person eating meals?: None Help from another person taking care of personal grooming?: A Little Help from another person toileting, which includes using toliet, bedpan, or urinal?: A Little Help from another person bathing (including washing, rinsing, drying)?: A Lot Help from another person to put on and taking off regular upper body clothing?: A Little Help from another person to put on and taking off regular lower body clothing?: A Lot 6 Click Score: 17   End of Session Equipment Utilized During Treatment: Gait belt;Rolling  walker  Activity Tolerance: Patient tolerated treatment well Patient left: in bed;with call bell/phone within reach;with family/visitor present  OT Visit Diagnosis: Unsteadiness on feet (R26.81);Muscle weakness (generalized) (M62.81);History of falling (Z91.81)                Time: [id number] OT Time Calculation (min): 35 min Charges:  OT General Charges $OT Visit: 1 Visit OT Evaluation $OT Eval Moderate Complexity: 1 Mod OT Treatments $Self Care/Home Management : 8-22 mins  Gerrianne Scale, MS, OTR/L ascom [phone] 06/16/19, 5:47 PM

## 2019-06-16 NOTE — Consult Note (Signed)
PHARMACY CONSULT NOTE - FOLLOW UP  Pharmacy Consult for Electrolyte Monitoring and Replacement   Recent Labs: Potassium (mmol/L)  Date Value  06/16/2019 3.5   Magnesium (mg/dL)  Date Value  06/15/2019 1.8   Calcium (mg/dL)  Date Value  06/16/2019 8.7 (L)   Albumin (g/dL)  Date Value  06/15/2019 3.6   Sodium (mmol/L)  Date Value  06/16/2019 143     Assessment: 78 y.o. female with medical history significant of hypertension, GERD, lymphoma on chemotherapy in [facility], who presents with near syncope. K+ 3.5 and Mg 1.8.   Goal of Therapy:  WNL  Plan:  Will give KCl 40 mEq PO x1 and Mg 2 g IV x 1.   Oswald Hillock ,PharmD Clinical Pharmacist 06/16/2019 1:31 PM

## 2019-06-16 NOTE — ED Notes (Signed)
Pt assisted to recliner chair at this time. Pt able to stand and pivot into chair. Pt expresses no further needs at this time.

## 2019-06-16 NOTE — Progress Notes (Signed)
Progress Note  Patient Name: Christie Farmer Date of Encounter: 06/16/2019  Primary Cardiologist: New- Dr. Rockey Situ  Subjective   No acute events overnight.  Feels tired this morning.  Inpatient Medications    Scheduled Meds: . aspirin EC  81 mg Oral Daily  . feeding supplement (ENSURE ENLIVE)  237 mL Oral BID BM  . heparin  5,000 Units Subcutaneous Q8H  . loratadine  10 mg Oral Daily  . [START ON 06/17/2019] metoprolol succinate  100 mg Oral Daily  . metoprolol tartrate  50 mg Oral Once  . multivitamin with minerals  1 tablet Oral Daily  . potassium chloride  40 mEq Oral Once  . sacubitril-valsartan  1 tablet Oral BID   Continuous Infusions: . sodium chloride 75 mL/hr at 06/15/19 1608  . magnesium sulfate bolus IVPB 2 g (06/16/19 1415)   PRN Meds: acetaminophen **OR** acetaminophen, hydrALAZINE, ondansetron **OR** ondansetron (ZOFRAN) IV, polyethylene glycol, traMADol   Vital Signs    Vitals:   06/16/19 1115 06/16/19 1130 06/16/19 1145 06/16/19 1200  BP:    122/82  Pulse: 94 93 88 (!) 102  Resp:    (!) 9  Temp:      TempSrc:      SpO2: 100% 100% 100% 100%  Weight:      Height:        Intake/Output Summary (Last 24 hours) at 06/16/2019 1421 Last data filed at 06/15/2019 1429 Gross per 24 hour  Intake 50 ml  Output -  Net 50 ml   Last 3 Weights 06/15/2019 03/02/2019 03/02/2019  Weight (lbs) 160 lb 150 lb 9.2 oz 152 lb  Weight (kg) 72.576 kg 68.3 kg 68.947 kg      Telemetry    Sinus rhythm, heart rate 90s.- Personally Reviewed   Physical Exam   GEN: No acute distress.  Looks weak and tired Neck: No JVD Cardiac: RRR, no murmurs, rubs, or gallops.  Respiratory: Clear to auscultation bilaterally. GI: Soft, nontender, non-distended  MS: No edema; No deformity. Neuro:  Nonfocal  Psych: Normal affect   Labs    High Sensitivity Troponin:   Recent Labs  Lab 06/15/19 0746 06/15/19 1006 06/15/19 1317  TROPONINIHS 79* 84* 86*      Chemistry Recent  Labs  Lab 06/15/19 0746 06/16/19 0638  NA 143 143  K 3.0* 3.5  CL 104 109  CO2 28 26  GLUCOSE 139* 95  BUN 11 8  CREATININE 0.71 0.69  CALCIUM 9.4 8.7*  PROT 6.0*  --   ALBUMIN 3.6  --   AST 38  --   ALT 22  --   ALKPHOS 64  --   BILITOT 1.0  --   GFRNONAA >60 >60  GFRAA >60 >60  ANIONGAP 11 8     Hematology Recent Labs  Lab 06/15/19 0746 06/16/19 0638  WBC 16.7* 12.0*  RBC 3.85* 3.30*  HGB 11.4* 9.7*  HCT 35.0* 31.6*  MCV 90.9 95.8  MCH 29.6 29.4  MCHC 32.6 30.7  RDW 17.3* 17.6*  PLT 279 246    BNP Recent Labs  Lab 06/15/19 0746  BNP 455.0*     DDimer No results for input(s): DDIMER in the last 168 hours.   Radiology    Dg Knee 2 Views Left  Result Date: 06/15/2019 CLINICAL DATA:  Left knee pain EXAM: LEFT KNEE - 1-2 VIEW COMPARISON:  None. FINDINGS: No acute fracture or dislocation or joint effusion is identified. Narrow femoral tibial joint space with osteophyte formation  are noted. Chondrocalcinosis is identified in the femoral tibial joint. IMPRESSION: No acute fracture or dislocation. Osteoarthritic changes of left knee. Electronically Signed   By: Abelardo Diesel M.D.   On: 06/15/2019 09:05   Dg Chest Portable 1 View  Result Date: 06/15/2019 CLINICAL DATA:  Generalized weakness EXAM: PORTABLE CHEST 1 VIEW COMPARISON:  None. FINDINGS: Right chest wall port catheter tip is near the cavoatrial junction. Low lung volumes. No focal consolidation or edema. No pleural effusion or pneumothorax. Normal heart size. Calcification is present along the aortic arch. IMPRESSION: No acute process in the chest. Electronically Signed   By: Macy Mis M.D.   On: 06/15/2019 08:16    Cardiac Studies   TTE 14/10/2018 1. Left ventricular ejection fraction, by visual estimation, is 30 %. The left ventricle has moderate to severely decreased function. There is no left ventricular hypertrophy.  2. Mildly dilated left ventricular internal cavity size.  3. The left  ventricle demonstrates global hypokinesis.  4. Left ventricular diastolic parameters are consistent with Grade II diastolic dysfunction (pseudonormalization).  5. Global right ventricle has normal systolic function.The right ventricular size is normal. No increase in right ventricular wall thickness.  6. Left atrial size was normal.  7. Normal pulmonary artery systolic pressure.  8. Rhythm is normal sinus rate 87 bpm  Patient Profile     78 y.o. female with history of lymphoma on chemo followed at [facility], presents with weakness.  Found to have sinus tach with frequent PACs and heart failure reduced ejection fraction, EF 30%.  Assessment & Plan    1. Sinus tachycardia with PACs -EKG/telemetry reviewed showing sinus tach with PACs -Heart rates improved since starting beta-blocker currently in sinus rhythm -Transition to Toprol-XL 100 mg daily starting tomorrow due to reduced EF. -2-week cardiac monitor mailed to patient's home to evaluate for any arrhythmias.  2.  Heart failure reduced EF, EF 30% -Patient is euvolemic -Start Toprol-XL as above -Start Entresto 24/26 mg twice daily -Further titration can be done as outpatient -Etiology of reduced ejection fraction is possibly chemo induced/NICM versus ischemia.  Farmer-up can be performed as outpatient.      Signed, Kate Sable, MD  06/16/2019, 2:21 PM

## 2019-06-16 NOTE — Evaluation (Signed)
Physical Therapy Evaluation Patient Details Name: Christie Farmer MRN: [id number] DOB: Nov 13, 1940 Today's Date: 06/16/2019   History of Present Illness  presented to ER secondary to progressive weakness, lightheadedness and dizziness; admitted for management of near syncope, sinus tachycardia with frequent PACs.  Noted with mild elevation in troponin, likely demand ischemia per notes.  Clinical Impression  Upon evaluation, patient alert and oriented; agreeable to session with min encouragement from PT.  Reports generalized weakness, but denies persistent dizziness/lightheadedness at this time.  Does endorse L knee/LE soreness (unrated); imaging negative for acute osseous injury.  Currently requiring min assist for bed mobility; sit/stand, standing balance with RW.  Globally weak and deconditioned; unable to tolerate standing >30 seconds or OOB/gait efforts as result.  Will continue to assess/progress as medically appropriate and available. Of note, mild drop in BP with transition from supine to sit; unable to tolerate standing for full BP assessment (but denied dizziness). Would benefit from skilled PT to address above deficits and promote optimal return to PLOF; Recommend transition to [facility] upon discharge from acute hospitalization.     Follow Up Recommendations Home health PT    Equipment Recommendations       Recommendations for Other Services       Precautions / Restrictions Precautions Precautions: Fall Precaution Comments: R chest port Restrictions Weight Bearing Restrictions: No      Mobility  Bed Mobility Overal bed mobility: Needs Assistance Bed Mobility: Supine to Sit;Sit to Supine     Supine to sit: Min assist Sit to supine: Min assist      Transfers Overall transfer level: Needs assistance Equipment used: Rolling walker (2 wheeled) Transfers: Sit to/from Stand Sit to Stand: Min assist         General transfer comment: assist for lift off and stabilization  once upright  Ambulation/Gait             General Gait Details: patient declined due to generalized weakness and fatigue  Stairs            Wheelchair Mobility    Modified Rankin (Stroke Patients Only)       Balance Overall balance assessment: Needs assistance Sitting-balance support: No upper extremity supported;Feet supported Sitting balance-Leahy Scale: Fair     Standing balance support: Bilateral upper extremity supported Standing balance-Leahy Scale: Poor                               Pertinent Vitals/Pain Pain Assessment: Faces Faces Pain Scale: Hurts little more Pain Location: L LE Pain Descriptors / Indicators: Aching;Sore Pain Intervention(s): Limited activity within patient's tolerance;Monitored during session;Repositioned    Home Living Family/patient expects to be discharged to:: Private residence Living Arrangements: Children Available Help at Discharge: Family;Available 24 hours/day Type of Home: House Home Access: Stairs to enter Entrance Stairs-Rails: None Entrance Stairs-Number of Steps: 4-5 Home Layout: Two level;Able to live on main level with bedroom/bathroom Home Equipment: Gilford Rile - 2 wheels      Prior Function Level of Independence: Independent         Comments: Indep with ADLs, household and community mobilization without assist device, but recent use of RW since previous hospitalization (August, 2020); does endorse 2-3 falls within previous six months.  Actively paticipation with HHPT prior to admission.     Hand Dominance        Extremity/Trunk Assessment   Upper Extremity Assessment Upper Extremity Assessment: Generalized weakness    Lower Extremity Assessment  Lower Extremity Assessment: Generalized weakness(grossly 4-/5 throughout, L LE generally guarded due to LE soreness)       Communication   Communication: No difficulties  Cognition Arousal/Alertness: Awake/alert Behavior During Therapy: WFL  for tasks assessed/performed Overall Cognitive Status: Within Functional Limits for tasks assessed                                        General Comments      Exercises Other Exercises Other Exercises: Rolling bilat, sup/mod indep (heavy use of bedrails)--dep for hygiene, clothing and linen change. Other Exercises: Sit/stand x2 with RW, min assist for lift off and balance; fatigue, generalized weakness limits further progression.  Requiring return to supine per patient comfort/preference.   Assessment/Plan    PT Assessment Patient needs continued PT services  PT Problem List Decreased strength;Decreased range of motion;Decreased balance;Decreased mobility;Decreased activity tolerance;Decreased knowledge of use of DME;Decreased safety awareness;Decreased knowledge of precautions       PT Treatment Interventions DME instruction;Gait training;Stair training;Functional mobility training;Therapeutic activities;Therapeutic exercise;Balance training;Patient/family education    PT Goals (Current goals can be found in the Care Plan section)  Acute Rehab PT Goals Patient Stated Goal: to get my strength back PT Goal Formulation: With patient/family Time For Goal Achievement: 06/30/19 Potential to Achieve Goals: Good    Frequency Min 2X/week   Barriers to discharge        Co-evaluation               AM-PAC PT "6 Clicks" Mobility  Outcome Measure Help needed turning from your back to your side while in a flat bed without using bedrails?: None Help needed moving from lying on your back to sitting on the side of a flat bed without using bedrails?: None Help needed moving to and from a bed to a chair (including a wheelchair)?: A Little Help needed standing up from a chair using your arms (e.g., wheelchair or bedside chair)?: A Little Help needed to walk in hospital room?: A Little Help needed climbing 3-5 steps with a railing? : A Lot 6 Click Score: 19    End of  Session Equipment Utilized During Treatment: Gait belt Activity Tolerance: Patient tolerated treatment well Patient left: in bed;with call bell/phone within reach;with family/visitor present Nurse Communication: Mobility status PT Visit Diagnosis: Muscle weakness (generalized) (M62.81);Difficulty in walking, not elsewhere classified (R26.2)    Time: [id number] PT Time Calculation (min) (ACUTE ONLY): 28 min   Charges:   PT Evaluation $PT Eval Moderate Complexity: 1 Mod PT Treatments $Therapeutic Activity: 8-22 mins        Iyannah Blake H. Owens Shark, PT, DPT, NCS 06/16/19, 1:49 PM [phone]

## 2019-06-16 NOTE — Progress Notes (Signed)
MD notified of bp 96/70. Orders for NS bolus 250cc and hold bp meds

## 2019-06-16 NOTE — ED Notes (Signed)
Melody, NT helped pt onto and off bedpan, uncharted output of urine approx 300 mls

## 2019-06-17 DIAGNOSIS — R778 Other specified abnormalities of plasma proteins: Secondary | ICD-10-CM | POA: Diagnosis not present

## 2019-06-17 DIAGNOSIS — E43 Unspecified severe protein-calorie malnutrition: Secondary | ICD-10-CM | POA: Insufficient documentation

## 2019-06-17 DIAGNOSIS — R000 Tachycardia, unspecified: Secondary | ICD-10-CM | POA: Diagnosis not present

## 2019-06-17 DIAGNOSIS — I5020 Unspecified systolic (congestive) heart failure: Secondary | ICD-10-CM | POA: Diagnosis not present

## 2019-06-17 LAB — BASIC METABOLIC PANEL
Anion gap: 9 (ref 5–15)
BUN: 7 mg/dL — ABNORMAL LOW (ref 8–23)
CO2: 24 mmol/L (ref 22–32)
Calcium: 8.7 mg/dL — ABNORMAL LOW (ref 8.9–10.3)
Chloride: 108 mmol/L (ref 98–111)
Creatinine, Ser: 0.64 mg/dL (ref 0.44–1.00)
GFR calc Af Amer: >60 mL/min (ref >60)
GFR calc non Af Amer: >60 mL/min (ref >60)
Glucose, Bld: 110 mg/dL — ABNORMAL HIGH (ref 70–99)
Potassium: 3.7 mmol/L (ref 3.5–5.1)
Sodium: 141 mmol/L (ref 135–145)

## 2019-06-17 LAB — URINE CULTURE: Culture: <10000 — AB

## 2019-06-17 LAB — MAGNESIUM: Magnesium: 2 mg/dL (ref 1.7–2.4)

## 2019-06-17 MED ORDER — OXYCODONE HCL 5 MG PO TABS
5.0000 mg | ORAL_TABLET | ORAL | Status: DC | PRN
  Administered 2019-06-17 – 2019-06-20 (×5): 5 mg via ORAL
  Filled 2019-06-17 (×6): qty 1

## 2019-06-17 MED ORDER — METOPROLOL SUCCINATE ER 100 MG PO TB24: 100.0000 mg | ORAL_TABLET | Freq: Every day | ORAL | 11 refills | Status: AC

## 2019-06-17 MED ORDER — ASPIRIN 81 MG PO TBEC: 81.0000 mg | DELAYED_RELEASE_TABLET | Freq: Every day | ORAL | 0 refills | Status: AC

## 2019-06-17 MED ORDER — METOPROLOL SUCCINATE ER 50 MG PO TB24
150.0000 mg | ORAL_TABLET | Freq: Every day | ORAL | Status: DC
  Administered 2019-06-17: 150 mg via ORAL
  Filled 2019-06-17: qty 3

## 2019-06-17 MED ORDER — SACUBITRIL-VALSARTAN 24-26 MG PO TABS: 1.0000 | ORAL_TABLET | Freq: Two times a day (BID) | ORAL | 0 refills | Status: DC

## 2019-06-17 MED ORDER — ENSURE ENLIVE PO LIQD
237.0000 mL | Freq: Three times a day (TID) | ORAL | Status: DC
  Administered 2019-06-18 – 2019-06-20 (×6): 237 mL via ORAL

## 2019-06-17 MED ORDER — METOPROLOL TARTRATE 50 MG PO TABS
50.0000 mg | ORAL_TABLET | Freq: Four times a day (QID) | ORAL | Status: DC
  Administered 2019-06-17 – 2019-06-18 (×2): 50 mg via ORAL
  Filled 2019-06-17 (×4): qty 1

## 2019-06-17 NOTE — Progress Notes (Signed)
Progress Note  Patient Name: Christie Farmer Date of Encounter: 06/17/2019  Primary Cardiologist: New- Dr. Rockey Situ  Subjective   No acute events overnight.  Feels cold.  Inpatient Medications    Scheduled Meds: . aspirin EC  81 mg Oral Daily  . Chlorhexidine Gluconate Cloth  6 each Topical Daily  . feeding supplement (ENSURE ENLIVE)  237 mL Oral BID BM  . heparin  5,000 Units Subcutaneous Q8H  . loratadine  10 mg Oral Daily  . metoprolol tartrate  50 mg Oral Q6H  . multivitamin with minerals  1 tablet Oral Daily  . sacubitril-valsartan  1 tablet Oral BID   Continuous Infusions:  PRN Meds: acetaminophen **OR** acetaminophen, hydrALAZINE, ondansetron **OR** ondansetron (ZOFRAN) IV, polyethylene glycol, traMADol   Vital Signs    Vitals:   06/16/19 2242 06/17/19 0342 06/17/19 0716 06/17/19 1130  BP: 116/71 123/79 (!) 128/104 104/61  Pulse: 96 (!) 104 (!) 110 (!) 112  Resp: 16 20 19    Temp:  98.3 F (36.8 C) 98 F (36.7 C)   TempSrc:  Oral Oral   SpO2: 100% 100% 100%   Weight:      Height:        Intake/Output Summary (Last 24 hours) at 06/17/2019 1203 Last data filed at 06/17/2019 1029 Gross per 24 hour  Intake 1570.41 ml  Output 50 ml  Net 1520.41 ml   Last 3 Weights 06/15/2019 03/02/2019 03/02/2019  Weight (lbs) 160 lb 150 lb 9.2 oz 152 lb  Weight (kg) 72.576 kg 68.3 kg 68.947 kg      Telemetry    Sinus tachycardia heart rate in the 120s- Personally Reviewed   Physical Exam   GEN: No acute distress.  Looks weak and tired Neck: No JVD Cardiac: RRR, no murmurs, rubs, or gallops.  Respiratory: Clear to auscultation bilaterally. GI: Soft, nontender, non-distended  MS: No edema; No deformity. Neuro:  Nonfocal  Psych: Normal affect   Labs    High Sensitivity Troponin:   Recent Labs  Lab 06/15/19 0746 06/15/19 1006 06/15/19 1317 06/16/19 1412  TROPONINIHS 79* 84* 86* 93*      Chemistry Recent Labs  Lab 06/15/19 0746 06/16/19 0638 06/17/19  0620  NA 143 143 141  K 3.0* 3.5 3.7  CL 104 109 108  CO2 28 26 24   GLUCOSE 139* 95 110*  BUN 11 8 7*  CREATININE 0.71 0.69 0.64  CALCIUM 9.4 8.7* 8.7*  PROT 6.0*  --   --   ALBUMIN 3.6  --   --   AST 38  --   --   ALT 22  --   --   ALKPHOS 64  --   --   BILITOT 1.0  --   --   GFRNONAA >60 >60 >60  GFRAA >60 >60 >60  ANIONGAP 11 8 9      Hematology Recent Labs  Lab 06/15/19 0746 06/16/19 0638  WBC 16.7* 12.0*  RBC 3.85* 3.30*  HGB 11.4* 9.7*  HCT 35.0* 31.6*  MCV 90.9 95.8  MCH 29.6 29.4  MCHC 32.6 30.7  RDW 17.3* 17.6*  PLT 279 246    BNP Recent Labs  Lab 06/15/19 0746  BNP 455.0*     DDimer No results for input(s): DDIMER in the last 168 hours.   Radiology    No results found.  Cardiac Studies   TTE 14/10/2018 1. Left ventricular ejection fraction, by visual estimation, is 30 %. The left ventricle has moderate to severely decreased function.  There is no left ventricular hypertrophy.  2. Mildly dilated left ventricular internal cavity size.  3. The left ventricle demonstrates global hypokinesis.  4. Left ventricular diastolic parameters are consistent with Grade II diastolic dysfunction (pseudonormalization).  5. Global right ventricle has normal systolic function.The right ventricular size is normal. No increase in right ventricular wall thickness.  6. Left atrial size was normal.  7. Normal pulmonary artery systolic pressure.  8. Rhythm is normal sinus rate 87 bpm  Patient Profile     78 y.o. female with history of lymphoma on chemo followed at [facility], presents with weakness.  Found to have sinus tach with frequent PACs and heart failure reduced ejection fraction, EF 30%.  Assessment & Plan    1. Sinus tachycardia with PACs -EKG/telemetry reviewed showing sinus tach with PACs -Tachycardic -Lopressor 50 mg every 6 Hours ordered. -2-week cardiac monitor mailed to patient's home to evaluate for any arrhythmias.  2.  Heart failure reduced EF, EF 30%  -Patient is euvolemic -Lopressor 50 mg every 6 hours as above to control heart rate.  When heart rate is stable convert to long-acting beta-blocker -Start Entresto 24/26 mg twice daily -Further titration can be done as outpatient -Etiology of reduced ejection fraction is possibly chemo induced/NICM versus ischemia.  Work-up can be performed as outpatient.      Signed, Kate Sable, MD  06/17/2019, 12:03 PM

## 2019-06-17 NOTE — Progress Notes (Signed)
Initial Nutrition Assessment  DOCUMENTATION CODES:   Severe malnutrition in context of chronic illness  INTERVENTION:  Recommend liberalizing diet to regular.  Continue Ensure Enlive po TID, each supplement provides 350 kcal and 20 grams of protein.  Provide Magic cup TID with meals, each supplement provides 290 kcal and 9 grams of protein.  Continue daily MVI.  Encouraged patient to take in small, frequent meals and to try to consume 2-3 servings of high-calorie, high-protein oral nutrition supplement daily. Patient reports she is unsure if she will be able to do any more than she is taking in now.  Consider Palliative Medicine consult to discuss goals of care.  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   Met with patient and her daughter at bedside. Patient reports her appetite has been poor since she started on chemotherapy in August of this year. She attempts to eat throughout the day but is unable to tolerate much intake at all. After several probing questions patient is unable to provide any details on how often she eats during the day or how much she eats at a time. She does endorse that she has Ensure and Boost at home and drinks them. RD encouraged her to have at least 2-3 bottles per day  and patient reported she would never be able to drink that much. However, she is unable to report how much she does drink of her ONS at home. Daughter reports that when patient initially started treatment she was having a lot of pain and early satiety with meals and that is why she initially stopped eating well. Patient ate 0% of breakfast today and did not want any of her lunch tray at time of RD assessment. She appears to have taken a few sips of Ensure.  Patient reports her UBW was 180 lbs. Did not see a weight that high documented in chart but she was 69.3 kg (152.7 lbs) on 02/22/2019 per Care Everywhere. She reports the weight in our chart of 160 lbs is inaccurate and reports she was in the 120s when last weighed at the cancer center. According to Care Everywhere she was 58.7 kg (129.5 lbs) on 05/18/2019. Patient has lost 10.6 kg (15.3% body weight) over 3 months, which is significant for time frame.  Medications reviewed and include: MVI daily.  Labs reviewed.  NUTRITION - FOCUSED PHYSICAL EXAM:    Most Recent Value  Orbital Region  Severe depletion  Upper Arm Region  Moderate depletion  Thoracic and Lumbar Region  Moderate depletion  Buccal Region  Severe depletion  Temple Region  Severe depletion  Clavicle Bone Region  Severe depletion  Clavicle and Acromion Bone Region  Severe depletion  Scapular Bone Region  Unable to assess  Dorsal Hand  Severe depletion  Patellar Region  Moderate depletion  Anterior Thigh Region  Moderate depletion  Posterior Calf Region  Severe depletion  Edema (RD Assessment)  None  Hair  Reviewed  Eyes  Reviewed  Mouth  Reviewed  Skin  Reviewed  Nails  Reviewed     Diet Order:   Diet Order            Diet - low sodium heart healthy        Diet Heart Room service appropriate? Yes; Fluid consistency: Thin  Diet effective now             EDUCATION NEEDS:   No education needs have been identified at this time  Skin:  Skin Assessment: Reviewed RN  Assessment  Last BM:  06/17/2019 - small type 5  Height:   Ht Readings from Last 1 Encounters:  06/15/19 5' 4"  (1.626 m)   Weight:   Wt Readings from Last 1 Encounters:  06/15/19 72.6 kg   Ideal Body Weight:  54.5 kg  BMI:  Body mass index is 27.46 kg/m.  Estimated Nutritional Needs:   Kcal:  1600-1800  Protein:  80-90 grams  Fluid:  1.6-1.8 L/day  Jacklynn Barnacle, MS, RD, LDN Office: [phone] Pager: [phone] After Hours/Weekend Pager: [phone]

## 2019-06-17 NOTE — Consult Note (Signed)
PHARMACY CONSULT NOTE - FOLLOW UP  Pharmacy Consult for Electrolyte Monitoring and Replacement   Recent Labs: Potassium (mmol/L)  Date Value  06/17/2019 3.7   Magnesium (mg/dL)  Date Value  06/17/2019 2.0   Calcium (mg/dL)  Date Value  06/17/2019 8.7 (L)   Albumin (g/dL)  Date Value  06/15/2019 3.6   Sodium (mmol/L)  Date Value  06/17/2019 141     Assessment: 78 y.o. female with medical history significant of hypertension, GERD, lymphoma on chemotherapy in [facility], who presents with near syncope. K+ 3.7and Mg 2.   Goal of Therapy:  WNL  Plan:  No replacement needed. F/u with BMP with AM labs.   Oswald Hillock ,PharmD Clinical Pharmacist 06/17/2019 9:05 AM

## 2019-06-17 NOTE — Progress Notes (Addendum)
PROGRESS NOTE    Christie Farmer  [id number] DOB: Nov 19, 1940 DOA: 06/15/2019 PCP: Patient, No Pcp Per  Brief Narrative:  Per HPI: Christie Farmer is a 78 y.o. female with medical history significant of hypertension, GERD, lymphoma on chemotherapy in [facility], who presents with near syncope.   Pt states that she is currently doing chemotherapy in [facility], which causes generalized weakness.  She also has lightheadedness and dizziness sometimes.  In several situation, she almost passed out, but did not.  She states that she fell a week ago with left knee injury, but strongly denies head or neck injury.  She refused my offer of CT scan of head and neck. She was found to have tachycardia with heart rate up to 140s in ED. She had heart racing feeling in the early morning, which has resolved. Patient denies chest pain, shortness of breath.  No cough, fever or chills.  Patient does not have nausea, vomiting, diarrhea, abdominal pain, symptoms of UTI.  No unilateral weakness or numbness in extremities.  No facial droop or slurred speech.  12/5: No acute status changes.  Patient's main complaint is pain in her left leg.  Continues to endorse some weakness.  Blood pressure heart rate remained mildly elevated.  Echocardiogram pending.  12/6: No acute status changes.  Feels cold.  Continues to have issues with rate control.  Remains somewhat tachycardic despite initiation of beta-blocker therapy.   Assessment & Plan:   Principal Problem:   Elevated troponin Active Problems:   Hypertension   Lymphoma (HCC)   Hypokalemia   Lactic acidosis   Sinus tachycardia   Near syncope  Sinus tachycardia with frequent PACs: initially suspected A fib with RVR. Card was consulted, per Dr. Oley Balm, pt does not have atrial fibrillation, patient most likely to have sinus tachycardia with frequent PAC, though SVT cannot be completely ruled out.  Dr. Rockey Situ will Zio monitor as outpatient   -Diltiazem drip weaned off -  Metoprolol tartrate 50q6 per cardiology - Plan to transition to toprol-XL once improved rate control achieved  -2D echocardiogram: EF 30% -Outpatient follow-up with cardiology   Acute systolic heart failure Chronicity difficult to determine No history of heart failure Not in exacerbation Cards consulted Beta blocker therapy as above Entresto started per cardiology Further ischemic workup as outpatient  Severe protein calorie malnutrition Discussed with nutritionist Patient has had 15% body weight loss in 3 months Liberalize diet to regular Per nutritionist, recommend consultation with palliative care for Cando discussion  Elevated troponin: no CP.  Possible demand ischemia. -Troponin peaked at 86 on 06/15/2019, recheck today - f/u 2d echo: see above - continue ASA -check A1c and FLp   Near syncope: Likely multifactorial etiology, including tachycardia, dehydration, ongoing chemotherapy which is causing weakness. -pt/OT -pt refused CT-head -IVF   Hypokalemia: K=3.3  on admission. Mg 1.8 Replete potassium and magnesium as needed   Hypertension: Metoprolol tartrate 100 mg p.o. twice daily As needed hydralazine   Lymphoma ([place]): on chemo currently -f/u in [facility] with oncology   Lactic acidosis: Lactic acid 2.3, which is resolved --> 1.7 with IVF. No fever. No signs of infection.  Low suspicions for sepsis.  Possibly due to dehydration -IV fluid: 1 L normal saline, followed by 75 cc/h    DVT prophylaxis: Subcu heparin Code Status: Full Family Communication: None today  disposition Plan: Anticipate home within 24-48 hours   Consultants:   Cardiology  Procedures:   None   Antimicrobials:   None    Subjective:  Seen and examined No acute interval events Feels cold  Objective: Vitals:   06/16/19 2242 06/17/19 0342 06/17/19 0716 06/17/19 1130  BP: 116/71 123/79 (!) 128/104 104/61  Pulse: 96 (!) 104 (!) 110 (!) 112  Resp: 16 20 19    Temp:  98.3 F (36.8 C)  98 F (36.7 C)   TempSrc:  Oral Oral   SpO2: 100% 100% 100%   Weight:      Height:        Intake/Output Summary (Last 24 hours) at 06/17/2019 1218 Last data filed at 06/17/2019 1029 Gross per 24 hour  Intake 1570.41 ml  Output 50 ml  Net 1520.41 ml   Filed Weights   06/15/19 0742  Weight: 72.6 kg    Examination: General: Not in acute distress.  Dry mucus membrane, appears stated HEENT:       Eyes: PERRL, EOMI, no scleral icterus.       ENT: No discharge from the ears and nose, no pharynx injection, no tonsillar enlargement.        Neck: No JVD, no bruit, no mass felt. Heme: No neck lymph node enlargement. Cardiac: S1/S2,  tachycardic, regular rhythm, no murmurs, No gallops or rubs. Respiratory:  No rales, wheezing, rhonchi or rubs. GI: Soft, nondistended, nontender, no rebound pain, no organomegaly, BS present. GU: No hematuria Ext: No pitting leg edema bilaterally. 2+DP/PT pulse bilaterally. Musculoskeletal: No joint deformities, No joint redness or warmth, no limitation of ROM in spin. Skin: No rashes.  Neuro: Alert, oriented X3, cranial nerves II-XII grossly intact, moves all extremities normally. Psych: Patient is not psychotic, no suicidal or hemocidal ideation.   Labs on Admission: I have personally reviewed following labs and imaging studies     Data Reviewed: I have personally reviewed following labs and imaging studies  CBC: Recent Labs  Lab 06/15/19 0746 06/16/19 0638  WBC 16.7* 12.0*  NEUTROABS 11.3*  --   HGB 11.4* 9.7*  HCT 35.0* 31.6*  MCV 90.9 95.8  PLT 279 0000000   Basic Metabolic Panel: Recent Labs  Lab 06/15/19 0746 06/16/19 0638 06/17/19 0620  NA 143 143 141  K 3.0* 3.5 3.7  CL 104 109 108  CO2 28 26 24   GLUCOSE 139* 95 110*  BUN 11 8 7*  CREATININE 0.71 0.69 0.64  CALCIUM 9.4 8.7* 8.7*  MG 1.8  --  2.0   GFR: Estimated Creatinine Clearance: 56.6 mL/min (by C-G formula based on SCr of 0.64 mg/dL). Liver Function Tests: Recent  Labs  Lab 06/15/19 0746  AST 38  ALT 22  ALKPHOS 64  BILITOT 1.0  PROT 6.0*  ALBUMIN 3.6   No results for input(s): LIPASE, AMYLASE in the last 168 hours. No results for input(s): AMMONIA in the last 168 hours. Coagulation Profile: No results for input(s): INR, PROTIME in the last 168 hours. Cardiac Enzymes: No results for input(s): CKTOTAL, CKMB, CKMBINDEX, TROPONINI in the last 168 hours. BNP (last 3 results) No results for input(s): PROBNP in the last 8760 hours. HbA1C: Recent Labs    06/16/19 0638  HGBA1C 4.9   CBG: Recent Labs  Lab 06/16/19 0931  GLUCAP 78   Lipid Profile: Recent Labs    06/16/19 0638  CHOL 186  HDL 62  LDLCALC 104*  TRIG 99  CHOLHDL 3.0   Thyroid Function Tests: Recent Labs    06/15/19 0746  TSH 3.241  FREET4 1.31*   Anemia Panel: No results for input(s): VITAMINB12, FOLATE, FERRITIN, TIBC, IRON, RETICCTPCT in the  last 72 hours. Sepsis Labs: Recent Labs  Lab 06/15/19 0746 06/15/19 1006 06/15/19 1101  PROCALCITON  --   --  0.23  LATICACIDVEN 2.3* 1.7  --     Recent Results (from the past 240 hour(s))  Culture, blood (x 2)     Status: None (Preliminary result)   Collection Time: 06/15/19  7:46 AM   Specimen: BLOOD  Result Value Ref Range Status   Specimen Description BLOOD RAC  Final   Special Requests   Final    BOTTLES DRAWN AEROBIC AND ANAEROBIC Blood Culture adequate volume   Culture   Final    NO GROWTH 2 DAYS Performed at [facility], [address] [place], [place] [address]    Report Status PENDING  Incomplete  SARS CORONAVIRUS 2 (TAT 6-24 HRS) Nasopharyngeal Nasopharyngeal Swab     Status: None   Collection Time: 06/15/19  8:30 AM   Specimen: Nasopharyngeal Swab  Result Value Ref Range Status   SARS Coronavirus 2 NEGATIVE NEGATIVE Final    Comment: (NOTE) SARS-CoV-2 target nucleic acids are NOT DETECTED. The SARS-CoV-2 RNA is generally detectable in upper and lower respiratory specimens during  the acute phase of infection. Negative results do not preclude SARS-CoV-2 infection, do not rule out co-infections with other pathogens, and should not be used as the sole basis for treatment or other patient management decisions. Negative results must be combined with clinical observations, patient history, and epidemiological information. The expected result is Negative. Fact Sheet for Patients: SugarRoll.be Fact Sheet for Healthcare Providers: https://www.woods-mathews.com/ This test is not yet approved or cleared by the [place] FDA and  has been authorized for detection and/or diagnosis of SARS-CoV-2 by FDA under an Emergency Use Authorization (EUA). This EUA will remain  in effect (meaning this test can be used) for the duration of the COVID-19 declaration under Section 56 4(b)(1) of the Act, 21 U.S.C. section 360bbb-3(b)(1), unless the authorization is terminated or revoked sooner. Performed at [facility], [place] [address], [place], [place] [address]   Urine culture     Status: Abnormal   Collection Time: 06/15/19  8:55 AM   Specimen: Urine, Random  Result Value Ref Range Status   Specimen Description   Final    URINE, RANDOM Performed at [facility], [address], [place], [place] [address]    Special Requests   Final    NONE Performed at [facility], [address] [place], [place] [address]    Culture (A)  Final    <10,000 COLONIES/mL INSIGNIFICANT GROWTH Performed at [facility], [place] [address], [place], [place] [address]    Report Status 06/17/2019 FINAL  Final  Culture, blood (x 2)     Status: None (Preliminary result)   Collection Time: 06/15/19 11:13 AM   Specimen: BLOOD  Result Value Ref Range Status   Specimen Description BLOOD [facility]  Final   Special Requests   Final    BOTTLES DRAWN AEROBIC AND ANAEROBIC Blood Culture results may not be optimal due to an inadequate volume of  blood received in culture bottles   Culture   Final    NO GROWTH 2 DAYS Performed at [facility], [address], [place], [place] [address]    Report Status PENDING  Incomplete         Radiology Studies: No results found.      Scheduled Meds: . aspirin EC  81 mg Oral Daily  . Chlorhexidine Gluconate  Cloth  6 each Topical Daily  . feeding supplement (ENSURE ENLIVE)  237 mL Oral BID BM  . heparin  5,000 Units Subcutaneous Q8H  . loratadine  10 mg Oral Daily  . metoprolol tartrate  50 mg Oral Q6H  . multivitamin with minerals  1 tablet Oral Daily  . sacubitril-valsartan  1 tablet Oral BID   Continuous Infusions:     LOS: 2 days    Time spent: 35 minutes    Sidney Ace, MD [facility] Pager [phone]  If 7PM-7AM, please contact night-coverage [email or web address] Password [email or web address] 06/17/2019, 12:18 PM

## 2019-06-18 ENCOUNTER — Encounter: Payer: Self-pay | Admitting: Internal Medicine

## 2019-06-18 DIAGNOSIS — R000 Tachycardia, unspecified: Secondary | ICD-10-CM | POA: Diagnosis not present

## 2019-06-18 DIAGNOSIS — I5021 Acute systolic (congestive) heart failure: Secondary | ICD-10-CM | POA: Diagnosis not present

## 2019-06-18 DIAGNOSIS — E43 Unspecified severe protein-calorie malnutrition: Secondary | ICD-10-CM

## 2019-06-18 LAB — BASIC METABOLIC PANEL
Anion gap: 5 (ref 5–15)
BUN: 11 mg/dL (ref 8–23)
CO2: 26 mmol/L (ref 22–32)
Calcium: 8.8 mg/dL — ABNORMAL LOW (ref 8.9–10.3)
Chloride: 110 mmol/L (ref 98–111)
Creatinine, Ser: 0.63 mg/dL (ref 0.44–1.00)
GFR calc Af Amer: >60 mL/min (ref >60)
GFR calc non Af Amer: >60 mL/min (ref >60)
Glucose, Bld: 113 mg/dL — ABNORMAL HIGH (ref 70–99)
Potassium: 3.8 mmol/L (ref 3.5–5.1)
Sodium: 141 mmol/L (ref 135–145)

## 2019-06-18 MED ORDER — SODIUM CHLORIDE 0.9% FLUSH: 10.0000 mL | INTRAVENOUS | Status: DC | PRN

## 2019-06-18 MED ORDER — METOPROLOL TARTRATE 50 MG PO TABS
100.0000 mg | ORAL_TABLET | Freq: Two times a day (BID) | ORAL | Status: DC
  Administered 2019-06-18 – 2019-06-20 (×4): 100 mg via ORAL
  Filled 2019-06-18 (×4): qty 2

## 2019-06-18 MED ORDER — SODIUM CHLORIDE 0.9% FLUSH
10.0000 mL | Freq: Two times a day (BID) | INTRAVENOUS | Status: DC
  Administered 2019-06-18 – 2019-06-20 (×4): 10 mL

## 2019-06-18 NOTE — Progress Notes (Signed)
Pt's BP soft. Provider notified. Okay with holding BP meds. Will continue to monitor.

## 2019-06-18 NOTE — Care Management Important Message (Signed)
Important Message  Patient Details  Name: Christie Farmer MRN: [id number] Date of Birth: August 24, 1940   Medicare Important Message Given:  Yes  Initial Medicare IM given by Patient Access Associate on 06/17/2019 at 11:31am.  Still valid.   Dannette Barbara 06/18/2019, 9:54 AM

## 2019-06-18 NOTE — Progress Notes (Signed)
Physical Therapy Treatment Patient Details Name: Christie Farmer MRN: [id number] DOB: 16-Jan-1941 Today's Date: 06/18/2019    History of Present Illness Pt is 78 y/o F who presented to ER secondary to progressive weakness, lightheadedness and dizziness; admitted for management of near syncope, sinus tachycardia with frequent PACs.  Noted with mild elevation in troponin, likely demand ischemia per notes.    PT Comments    Pt presented with deficits in strength, transfers, mobility, gait, balance, and activity tolerance.  Pt's BP in supine 118/66 with HR 98 bpm, seated 118/87 with HR 100, HR after amb 2-3 feet 124 bpm, nursing notified. BP in standing not taken secondary to limited standing tolerance and no signs/symptoms of orthostatic hypotension going from sup to sit. Pt overall very limited during the session by L knee pain, fatigue, and general weakness.  Pt initially declined to participate with therapy but agreed after pt education on physiological benefits of activity.  Pt presented with poor standing balance during transfers and amb and was only able to amb a max of 3-4 very small steps at the EOB before requiring mod A to return to sitting at the EOB safely.  Nursing updated on recommendation change from home with HHPT to SNF upon discharge from acute care in order to safely address the above deficits for eventual return towards PLOF.      Follow Up Recommendations  SNF     Equipment Recommendations  None recommended by PT    Recommendations for Other Services       Precautions / Restrictions Precautions Precautions: Fall Precaution Comments: R chest port Restrictions Weight Bearing Restrictions: No Other Position/Activity Restrictions: BP taken supine and sitting for fxl tasks. 111/69 supine and 117/94 in EOB sitting. No evidence of orthostatics today-pt does not c/o dizziness sitting EOB this date.    Mobility  Bed Mobility Overal bed mobility: Needs Assistance Bed Mobility:  Supine to Sit;Sit to Supine     Supine to sit: Supervision Sit to supine: Supervision   General bed mobility comments: Extra time and effort with pt using BUEs to assist her LLE out of bed  Transfers Overall transfer level: Needs assistance Equipment used: Rolling walker (2 wheeled) Transfers: Sit to/from Stand Sit to Stand: Min assist;Mod assist;From elevated surface         General transfer comment: Min to mod A to stand from an elevated EOB  Ambulation/Gait Ambulation/Gait assistance: Min assist;Mod assist Gait Distance (Feet): 2 Feet Assistive device: Rolling walker (2 wheeled) Gait Pattern/deviations: Step-to pattern;Antalgic;Decreased stance time - left;Decreased step length - right Gait velocity: decreased   General Gait Details: Antalgic gait on the LLE with min-mod A to prevent LOB; HR to the low 120s with limited amb up from 98 at rest.   Stairs             Wheelchair Mobility    Modified Rankin (Stroke Patients Only)       Balance Overall balance assessment: Needs assistance Sitting-balance support: No upper extremity supported;Feet supported Sitting balance-Leahy Scale: Good Sitting balance - Comments: Tolerates 15 mins in EOB sitting while BP is assessed, participates in reaching outside BOS to obtain gown to change into clean one. G static sitting G-/F+ dynamic sitting.   Standing balance support: Bilateral upper extremity supported;During functional activity Standing balance-Leahy Scale: Poor Standing balance comment: Min-mod A for stability during functional activity in standing  Cognition Arousal/Alertness: Awake/alert Behavior During Therapy: WFL for tasks assessed/performed Overall Cognitive Status: Within Functional Limits for tasks assessed                                 General Comments: some slow processing noted. Increased time to respond. Does require safety cueing.       Exercises Total Joint Exercises Ankle Circles/Pumps: Strengthening;Both;10 reps Quad Sets: Strengthening;Both;10 reps Heel Slides: AROM;Both;10 reps Hip ABduction/ADduction: AROM;AAROM;Both;10 reps Long Arc Quad: Strengthening;Both;10 reps;15 reps Knee Flexion: Strengthening;Both;10 reps;15 reps Other Exercises: bridges x 5 Other Exercises: Pt education provided regarding physiological benefits of activity    General Comments        Pertinent Vitals/Pain Pain Assessment: No/denies pain    Home Living                      Prior Function            PT Goals (current goals can now be found in the care plan section) Acute Rehab PT Goals Patient Stated Goal: to get my strength back Progress towards PT goals: Not progressing toward goals - comment(Pt limited by weakness, fatigue, and L knee pain with WB)    Frequency    Min 2X/week      PT Plan Discharge plan needs to be updated    Co-evaluation              AM-PAC PT "6 Clicks" Mobility   Outcome Measure  Help needed turning from your back to your side while in a flat bed without using bedrails?: A Little Help needed moving from lying on your back to sitting on the side of a flat bed without using bedrails?: A Little Help needed moving to and from a bed to a chair (including a wheelchair)?: A Lot Help needed standing up from a chair using your arms (e.g., wheelchair or bedside chair)?: A Lot Help needed to walk in hospital room?: Total Help needed climbing 3-5 steps with a railing? : Total 6 Click Score: 12    End of Session Equipment Utilized During Treatment: Gait belt Activity Tolerance: Patient limited by fatigue;Patient limited by pain Patient left: in bed;with call bell/phone within reach;with bed alarm set;with nursing/sitter in room Nurse Communication: Mobility status;Other (comment)(Pt's HR with activity and functional status) PT Visit Diagnosis: Muscle weakness (generalized)  (M62.81);Difficulty in walking, not elsewhere classified (R26.2)     Time: [id number] PT Time Calculation (min) (ACUTE ONLY): 23 min  Charges:  $Therapeutic Exercise: 8-22 mins $Therapeutic Activity: 8-22 mins                     D. Royetta Asal PT, DPT 06/18/19, 5:37 PM

## 2019-06-18 NOTE — Progress Notes (Signed)
Occupational Therapy Treatment Patient Details Name: Christie Farmer MRN: [id number] DOB: Nov 20, 1940 Today's Date: 06/18/2019    History of present illness Pt is 78 y/o F who presented to ER secondary to progressive weakness, lightheadedness and dizziness; admitted for management of near syncope, sinus tachycardia with frequent PACs.  Noted with mild elevation in troponin, likely demand ischemia per notes.   OT comments  Pt seen for OT tx this date to f/u re: sitting fxl activity tolerance and ADL safety/independence. OT engages pt in dressing tasks to improve fxl activity tolerance for performance of ADLs with pt requiring MIN A to don clean gown to front side and MIN/MOD to don socks while seated using cross-leg technique. Overall d/c recommendation of HHOT remains appropriate at this time.    Follow Up Recommendations  Home health OT;Supervision - Intermittent(supv-CGA for all standing, ADL transfers, and ADL mobility)    Equipment Recommendations       Recommendations for Other Services      Precautions / Restrictions Precautions Precautions: Fall Precaution Comments: R chest port Restrictions Weight Bearing Restrictions: No Other Position/Activity Restrictions: BP taken supine and sitting for fxl tasks. 111/69 supine and 117/94 in EOB sitting. No evidence of orthostatics today-pt does not c/o dizziness sitting EOB this date.       Mobility Bed Mobility Overal bed mobility: Needs Assistance Bed Mobility: Supine to Sit;Sit to Supine     Supine to sit: Min guard Sit to supine: Min guard      Transfers                      Balance Overall balance assessment: Needs assistance Sitting-balance support: No upper extremity supported;Feet supported Sitting balance-Leahy Scale: Good Sitting balance - Comments: Tolerates 15 mins in EOB sitting while BP is assessed, participates in reaching outside BOS to obtain gown to change into clean one. G static sitting G-/F+  dynamic sitting.                                   ADL either performed or assessed with clinical judgement   ADL                   Upper Body Dressing : Minimal assistance;Sitting   Lower Body Dressing: Min guard;Minimal assistance;Sitting/lateral leans Lower Body Dressing Details (indicate cue type and reason): to don socks                     Vision Patient Visual Report: No change from baseline     Perception     Praxis      Cognition Arousal/Alertness: Awake/alert Behavior During Therapy: WFL for tasks assessed/performed Overall Cognitive Status: Within Functional Limits for tasks assessed                                 General Comments: some slow processing noted. Increased time to respond. Does require safety cueing.        Exercises Other Exercises Other Exercises: OT facilitates education with pt's daughter re: what to check for when checking BP (orthostatics with reduction of 20 from sup to sit vs sit to stand). Dtr verbalized understanding, but requests printed material. Will attempt to obtain a resource for pt and family.   Shoulder Instructions       General Comments  Pertinent Vitals/ Pain       Pain Assessment: No/denies pain  Home Living                                          Prior Functioning/Environment              Frequency  Min 1X/week        Progress Toward Goals  OT Goals(current goals can now be found in the care plan section)  Progress towards OT goals: Progressing toward goals  Acute Rehab OT Goals Patient Stated Goal: to get my strength back OT Goal Formulation: With patient/family Time For Goal Achievement: 06/30/19 Potential to Achieve Goals: Good  Plan      Co-evaluation                 AM-PAC OT "6 Clicks" Daily Activity     Outcome Measure   Help from another person eating meals?: None Help from another person taking care of personal  grooming?: A Little Help from another person toileting, which includes using toliet, bedpan, or urinal?: A Little Help from another person bathing (including washing, rinsing, drying)?: A Lot Help from another person to put on and taking off regular upper body clothing?: A Little Help from another person to put on and taking off regular lower body clothing?: A Little 6 Click Score: 18    End of Session    OT Visit Diagnosis: Unsteadiness on feet (R26.81);Muscle weakness (generalized) (M62.81);History of falling (Z91.81)   Activity Tolerance Patient tolerated treatment well   Patient Left in bed;with call bell/phone within reach;with family/visitor present(dtr present)   Nurse Communication          [id number] OT Time Calculation (min): 25 min  Charges: OT General Charges $OT Visit: 1 Visit OT Treatments $Self Care/Home Management : 23-37 mins  Gerrianne Scale, MS, OTR/L ascom [phone] 06/18/19, 4:59 PM

## 2019-06-18 NOTE — Progress Notes (Addendum)
Progress Note    Judah Devoll  [id number] DOB: Jul 16, 1940  DOA: 06/15/2019 PCP: Patient, No Pcp Per       Assessment/Plan:   Principal Problem:   Acute systolic CHF (congestive heart failure) (HCC) Active Problems:   Hypertension   Lymphoma (HCC)   Elevated troponin   Hypokalemia   Lactic acidosis   Sinus tachycardia   Near syncope   Protein-calorie malnutrition, severe   Body mass index is 27.46 kg/m.    Acute systolic heart failure: EF estimated at 30%.  Cardiologist recommended continuation of metoprolol for discontinuation of Entresto because of borderline low blood pressure.  Sinus tachycardia with PACs: Continue metoprolol.  Unfortunately, patient became more tachycardic with heart rate in the 120s when she got up with PT today.  Continue to monitor on telemetry.  Elevated troponin: Likely from demand ischemia.  Hypokalemia: Improved  Recent near syncope: Likely multifactorial.  Patient appears stable.  Heart rate went up when she worked with PT.  Lymphoma: Follow-up with oncologist at [facility] for chemotherapy.  Severe protein calorie malnutrition: Continue nutritional supplements.  Follow-up with dietitian.  Left knee osteoarthritis: PT recommends discharge to SNF.  Follow-up with case manager to assist with disposition.   Family Communication/Anticipated D/C date and plan/Code Status   DVT prophylaxis: Heparin Code Status: Full code Family Communication: Plan discussed with the patient  disposition Plan: PT recommends discharge to SNF      Subjective:   She complains of pain in her left knee.  No shortness of breath, dizziness, chest pain or palpitations.  Objective:    Vitals:   06/17/19 2347 06/18/19 0500 06/18/19 0735 06/18/19 1029  BP: 103/60 106/61 103/61 113/64  Pulse: 98 89 90 99  Resp:  16 16 15   Temp:  97.9 F (36.6 C) (!) 97.4 F (36.3 C) 98 F (36.7 C)  TempSrc:  Oral Oral Oral  SpO2: 100% 100% 100% 100%  Weight:       Height:        Intake/Output Summary (Last 24 hours) at 06/18/2019 1749 Last data filed at 06/18/2019 1300 Gross per 24 hour  Intake 240 ml  Output 150 ml  Net 90 ml   Filed Weights   06/15/19 0742  Weight: 72.6 kg    Exam:  GEN: NAD SKIN: No rash EYES: EOMI ENT: MMM CV: RRR PULM: CTA B ABD: soft, ND, NT, +BS CNS: AAO x 3, non focal EXT: No edema or tenderness. No swelling, erythema or tenderness of the knees   Data Reviewed:   I have personally reviewed following labs and imaging studies:  Labs: Labs show the following:   Basic Metabolic Panel: Recent Labs  Lab 06/15/19 0746 06/16/19 0638 06/17/19 0620 06/18/19 0505  NA 143 143 141 141  K 3.0* 3.5 3.7 3.8  CL 104 109 108 110  CO2 28 26 24 26   GLUCOSE 139* 95 110* 113*  BUN 11 8 7* 11  CREATININE 0.71 0.69 0.64 0.63  CALCIUM 9.4 8.7* 8.7* 8.8*  MG 1.8  --  2.0  --    GFR Estimated Creatinine Clearance: 56.6 mL/min (by C-G formula based on SCr of 0.63 mg/dL). Liver Function Tests: Recent Labs  Lab 06/15/19 0746  AST 38  ALT 22  ALKPHOS 64  BILITOT 1.0  PROT 6.0*  ALBUMIN 3.6   No results for input(s): LIPASE, AMYLASE in the last 168 hours. No results for input(s): AMMONIA in the last 168 hours. Coagulation profile No results  for input(s): INR, PROTIME in the last 168 hours.  CBC: Recent Labs  Lab 06/15/19 0746 06/16/19 0638  WBC 16.7* 12.0*  NEUTROABS 11.3*  --   HGB 11.4* 9.7*  HCT 35.0* 31.6*  MCV 90.9 95.8  PLT 279 246   Cardiac Enzymes: No results for input(s): CKTOTAL, CKMB, CKMBINDEX, TROPONINI in the last 168 hours. BNP (last 3 results) No results for input(s): PROBNP in the last 8760 hours. CBG: Recent Labs  Lab 06/16/19 0931  GLUCAP 78   D-Dimer: No results for input(s): DDIMER in the last 72 hours. Hgb A1c: Recent Labs    06/16/19 0638  HGBA1C 4.9   Lipid Profile: Recent Labs    06/16/19 0638  CHOL 186  HDL 62  LDLCALC 104*  TRIG 99  CHOLHDL 3.0    Thyroid function studies: No results for input(s): TSH, T4TOTAL, T3FREE, THYROIDAB in the last 72 hours.  Invalid input(s): FREET3 Anemia work up: No results for input(s): VITAMINB12, FOLATE, FERRITIN, TIBC, IRON, RETICCTPCT in the last 72 hours. Sepsis Labs: Recent Labs  Lab 06/15/19 0746 06/15/19 1006 06/15/19 1101 06/16/19 0638  PROCALCITON  --   --  0.23  --   WBC 16.7*  --   --  12.0*  LATICACIDVEN 2.3* 1.7  --   --     Microbiology Recent Results (from the past 240 hour(s))  Culture, blood (x 2)     Status: None (Preliminary result)   Collection Time: 06/15/19  7:46 AM   Specimen: BLOOD  Result Value Ref Range Status   Specimen Description BLOOD RAC  Final   Special Requests   Final    BOTTLES DRAWN AEROBIC AND ANAEROBIC Blood Culture adequate volume   Culture   Final    NO GROWTH 3 DAYS Performed at [facility], [address] [place], [place] [address]    Report Status PENDING  Incomplete  SARS CORONAVIRUS 2 (TAT 6-24 HRS) Nasopharyngeal Nasopharyngeal Swab     Status: None   Collection Time: 06/15/19  8:30 AM   Specimen: Nasopharyngeal Swab  Result Value Ref Range Status   SARS Coronavirus 2 NEGATIVE NEGATIVE Final    Comment: (NOTE) SARS-CoV-2 target nucleic acids are NOT DETECTED. The SARS-CoV-2 RNA is generally detectable in upper and lower respiratory specimens during the acute phase of infection. Negative results do not preclude SARS-CoV-2 infection, do not rule out co-infections with other pathogens, and should not be used as the sole basis for treatment or other patient management decisions. Negative results must be combined with clinical observations, patient history, and epidemiological information. The expected result is Negative. Fact Sheet for Patients: SugarRoll.be Fact Sheet for Healthcare Providers: https://www.woods-mathews.com/ This test is not yet approved or cleared by the [place]  FDA and  has been authorized for detection and/or diagnosis of SARS-CoV-2 by FDA under an Emergency Use Authorization (EUA). This EUA will remain  in effect (meaning this test can be used) for the duration of the COVID-19 declaration under Section 56 4(b)(1) of the Act, 21 U.S.C. section 360bbb-3(b)(1), unless the authorization is terminated or revoked sooner. Performed at [facility], [place] [address] [place], [place] [address]   Urine culture     Status: Abnormal   Collection Time: 06/15/19  8:55 AM   Specimen: Urine, Random  Result Value Ref Range Status   Specimen Description   Final    URINE, RANDOM Performed at [facility], [address], [place], [place] [address]  Special Requests   Final    NONE Performed at [facility], [address] [place], [place] [address]    Culture (A)  Final    <10,000 COLONIES/mL INSIGNIFICANT GROWTH Performed at [facility] [address] [place], [place] [address]    Report Status 06/17/2019 FINAL  Final  Culture, blood (x 2)     Status: None (Preliminary result)   Collection Time: 06/15/19 11:13 AM   Specimen: BLOOD  Result Value Ref Range Status   Specimen Description BLOOD [facility]  Final   Special Requests   Final    BOTTLES DRAWN AEROBIC AND ANAEROBIC Blood Culture results may not be optimal due to an inadequate volume of blood received in culture bottles   Culture   Final    NO GROWTH 3 DAYS Performed at [facility], [address], [place], [place] [address]    Report Status PENDING  Incomplete    Procedures and diagnostic studies:  No results found.  Medications:   . aspirin EC  81 mg Oral Daily  . Chlorhexidine Gluconate Cloth  6 each Topical Daily  . feeding supplement (ENSURE ENLIVE)  237 mL Oral TID BM  . heparin  5,000 Units Subcutaneous Q8H  . loratadine  10 mg Oral Daily  . metoprolol tartrate  100 mg Oral BID  . multivitamin with minerals  1 tablet Oral Daily    Continuous Infusions:   LOS: 3 days   Tarry Blayney  [facility]   *Please refer to [email or web address], password [email or web address] to get updated schedule on who will round on this patient, as hospitalists switch teams weekly. If 7PM-7AM, please contact night-coverage at [email or web address], password [email or web address] for any overnight needs.  06/18/2019, 5:49 PM

## 2019-06-18 NOTE — Telephone Encounter (Signed)
Call to patient to review instructions for zio monitor. Spoke to daughter [name].   She verbalized understanding and all questions were answered.   Advised pt to call for any further questions or concerns.

## 2019-06-18 NOTE — Consult Note (Signed)
PHARMACY CONSULT NOTE - FOLLOW UP  Pharmacy Consult for Electrolyte Monitoring and Replacement   Recent Labs: Potassium (mmol/L)  Date Value  06/18/2019 3.8   Magnesium (mg/dL)  Date Value  06/17/2019 2.0   Calcium (mg/dL)  Date Value  06/18/2019 8.8 (L)   Albumin (g/dL)  Date Value  06/15/2019 3.6   Sodium (mmol/L)  Date Value  06/18/2019 141     Assessment: 78 y.o. female with medical history significant of hypertension, GERD, lymphoma on chemotherapy in [facility], who presents with near syncope. K+ 3.8 and Mg 2. Pt started on Entresto 12/5.   Goal of Therapy:  WNL  Plan:  No replacement needed. Potassium has been stable. Pharmacy will sign off.  Please re-consult if needed.   Oswald Hillock ,PharmD, BCPS Clinical Pharmacist 06/18/2019 7:57 AM

## 2019-06-18 NOTE — Progress Notes (Signed)
OT Cancellation Note  Patient Details Name: Christie Farmer MRN: [id number] DOB: 1940/12/31   Cancelled Treatment:    Reason Eval/Treat Not Completed: Other (comment)  Pt has just gotten situated on bed pan per nursing. Pt states she is attempting to have BM and requests follow up for therapy after. OT will f/u as able for treatment.   Gerrianne Scale, [place], OTR/L ascom [phone] 06/18/19, 9:58 AM

## 2019-06-18 NOTE — Progress Notes (Signed)
Progress Note  Patient Name: Christie Farmer Date of Encounter: 06/18/2019  Primary Cardiologist: New- Dr. Rockey Situ  Subjective   She denies chest pain, shortness of breath or dizziness.  Blood pressure has been low overnight.  Inpatient Medications    Scheduled Meds: . aspirin EC  81 mg Oral Daily  . Chlorhexidine Gluconate Cloth  6 each Topical Daily  . feeding supplement (ENSURE ENLIVE)  237 mL Oral TID BM  . heparin  5,000 Units Subcutaneous Q8H  . loratadine  10 mg Oral Daily  . metoprolol tartrate  100 mg Oral BID  . multivitamin with minerals  1 tablet Oral Daily   Continuous Infusions:  PRN Meds: acetaminophen **OR** acetaminophen, ondansetron **OR** ondansetron (ZOFRAN) IV, oxyCODONE, polyethylene glycol   Vital Signs    Vitals:   06/17/19 2347 06/18/19 0500 06/18/19 0735 06/18/19 1029  BP: 103/60 106/61 103/61 113/64  Pulse: 98 89 90 99  Resp:  16 16 15   Temp:  97.9 F (36.6 C) (!) 97.4 F (36.3 C) 98 F (36.7 C)  TempSrc:  Oral Oral Oral  SpO2: 100% 100% 100% 100%  Weight:      Height:        Intake/Output Summary (Last 24 hours) at 06/18/2019 1043 Last data filed at 06/18/2019 0900 Gross per 24 hour  Intake 0 ml  Output 150 ml  Net -150 ml   Last 3 Weights 06/15/2019 03/02/2019 03/02/2019  Weight (lbs) 160 lb 150 lb 9.2 oz 152 lb  Weight (kg) 72.576 kg 68.3 kg 68.947 kg      Telemetry    Intermittent sinus tachycardia with PACs.  No evidence of atrial fibrillation- Personally Reviewed   Physical Exam   GEN: No acute distress.  Looks weak and tired Neck: No JVD Cardiac: RRR, no murmurs, rubs, or gallops.  Respiratory: Clear to auscultation bilaterally. GI: Soft, nontender, non-distended  MS: No edema; No deformity. Neuro:  Nonfocal  Psych: Normal affect   Labs    High Sensitivity Troponin:   Recent Labs  Lab 06/15/19 0746 06/15/19 1006 06/15/19 1317 06/16/19 1412  TROPONINIHS 79* 84* 86* 93*      Chemistry Recent Labs  Lab  06/15/19 0746 06/16/19 0638 06/17/19 0620 06/18/19 0505  NA 143 143 141 141  K 3.0* 3.5 3.7 3.8  CL 104 109 108 110  CO2 28 26 24 26   GLUCOSE 139* 95 110* 113*  BUN 11 8 7* 11  CREATININE 0.71 0.69 0.64 0.63  CALCIUM 9.4 8.7* 8.7* 8.8*  PROT 6.0*  --   --   --   ALBUMIN 3.6  --   --   --   AST 38  --   --   --   ALT 22  --   --   --   ALKPHOS 64  --   --   --   BILITOT 1.0  --   --   --   GFRNONAA >60 >60 >60 >60  GFRAA >60 >60 >60 >60  ANIONGAP 11 8 9 5      Hematology Recent Labs  Lab 06/15/19 0746 06/16/19 0638  WBC 16.7* 12.0*  RBC 3.85* 3.30*  HGB 11.4* 9.7*  HCT 35.0* 31.6*  MCV 90.9 95.8  MCH 29.6 29.4  MCHC 32.6 30.7  RDW 17.3* 17.6*  PLT 279 246    BNP Recent Labs  Lab 06/15/19 0746  BNP 455.0*     DDimer No results for input(s): DDIMER in the last 168 hours.  Radiology    No results found.  Cardiac Studies   TTE 14/10/2018 1. Left ventricular ejection fraction, by visual estimation, is 30 %. The left ventricle has moderate to severely decreased function. There is no left ventricular hypertrophy.  2. Mildly dilated left ventricular internal cavity size.  3. The left ventricle demonstrates global hypokinesis.  4. Left ventricular diastolic parameters are consistent with Grade II diastolic dysfunction (pseudonormalization).  5. Global right ventricle has normal systolic function.The right ventricular size is normal. No increase in right ventricular wall thickness.  6. Left atrial size was normal.  7. Normal pulmonary artery systolic pressure.  8. Rhythm is normal sinus rate 87 bpm  Patient Profile     78 y.o. female with history of lymphoma on chemo followed at [facility], presents with weakness.  Found to have sinus tach with frequent PACs and heart failure reduced ejection fraction, EF 30%.  Assessment & Plan    1. Sinus tachycardia with PACs -Her blood pressure has been on the low side.  I am going to change metoprolol tartrate 100 mg twice  daily.  We can consider switching her to Toprol in the outpatient setting.  2.  Heart failure reduced EF, EF 30% -Patient is euvolemic -She was started on Entresto yesterday but since then her blood pressure has been low with occasional systolic readings below [id number].  I think the priority is to try to control her tachycardia and thus I elected to discontinue Entresto. We could consider a small dose ARB as an outpatient before switching to [facility] if she can tolerate that. The patient will require ischemic cardiac evaluation as an outpatient. Recommend ambulating the patient in the hallway and if she feels well with no dizziness, she can be discharged home from a cardiac standpoint.  We will make arrangements for follow-up in our office in 1 to 2 weeks.      Signed, Kathlyn Sacramento, MD  06/18/2019, 10:43 AM

## 2019-06-19 LAB — SARS CORONAVIRUS 2 (TAT 6-24 HRS): SARS Coronavirus 2: NEGATIVE

## 2019-06-19 NOTE — NC FL2 (Signed)
[place] LEVEL OF CARE SCREENING TOOL     IDENTIFICATION  Patient Name: Christie Farmer Birthdate: [birth year] Sex: female Admission Date (Current Location): 06/15/2019  [place] and [place] Number:  Engineering geologist and Address:  [facility], [address] [place], [place] [address]      Provider Number: [id number]  Attending Physician Name and Address:  Jennye Boroughs, MD  Relative Name and Phone Number:  Barrett Henle Daughter   [phone]    Current Level of Care: Hospital Recommended Level of Care: [place] Prior Approval Number:    Date Approved/Denied:   PASRR Number: [id number]  Discharge Plan: SNF    Current Diagnoses: Patient Active Problem List   Diagnosis Date Noted  . Acute systolic CHF (congestive heart failure) ([place]) 06/18/2019  . Protein-calorie malnutrition, severe 06/17/2019  . HFrEF (heart failure with reduced ejection fraction) ([place])   . Elevated troponin 06/15/2019  . Hypokalemia 06/15/2019  . Lactic acidosis 06/15/2019  . Sinus tachycardia 06/15/2019  . Near syncope 06/15/2019  . Hypertension   . Lymphoma ([place])   . ARF (acute renal failure) ([place]) 03/04/2019  . Generalized weakness 03/02/2019    Orientation RESPIRATION BLADDER Height & Weight     Self, Time, Situation, Place  Normal Continent Weight: 160 lb (72.6 kg) Height:  5\' 4"  (162.6 cm)  BEHAVIORAL SYMPTOMS/MOOD NEUROLOGICAL BOWEL NUTRITION STATUS      Continent Diet  AMBULATORY STATUS COMMUNICATION OF NEEDS Skin   Limited Assist Verbally Normal                       Personal Care Assistance Level of Assistance  Bathing, Feeding, Dressing Bathing Assistance: Limited assistance Feeding assistance: Limited assistance Dressing Assistance: Limited assistance     Functional Limitations Info  Sight, Speech, Hearing Sight Info: Adequate Hearing Info: Adequate Speech Info: Adequate    SPECIAL CARE FACTORS  FREQUENCY  PT (By licensed PT), OT (By licensed OT)     PT Frequency: Minimum 5x a week OT Frequency: Minimum 5x a week            Contractures Contractures Info: Not present    Additional Factors Info  Code Status, Allergies Code Status Info: Full Code Allergies Info: NKA           Current Medications (06/19/2019):  This is the current hospital active medication list Current Facility-Administered Medications  Medication Dose Route Frequency Provider Last Rate Last Dose  . acetaminophen (TYLENOL) tablet 650 mg  650 mg Oral Q6H PRN Ivor Costa, MD   650 mg at 06/18/19 1038   Or  . acetaminophen (TYLENOL) suppository 650 mg  650 mg Rectal Q6H PRN Ivor Costa, MD      . aspirin EC tablet 81 mg  81 mg Oral Daily Ivor Costa, MD   81 mg at 06/19/19 0959  . Chlorhexidine Gluconate Cloth 2 % PADS 6 each  6 each Topical Daily Ivor Costa, MD   6 each at 06/19/19 1001  . feeding supplement (ENSURE ENLIVE) (ENSURE ENLIVE) liquid 237 mL  237 mL Oral TID BM Sreenath, Sudheer B, MD   237 mL at 06/19/19 1001  . heparin injection 5,000 Units  5,000 Units Subcutaneous Q8H Ivor Costa, MD   5,000 Units at 06/19/19 [phone]  . loratadine (CLARITIN) tablet 10 mg  10 mg Oral Daily Ivor Costa, MD   10 mg at 06/19/19 0959  . metoprolol tartrate (LOPRESSOR) tablet 100 mg  100 mg  Oral BID Wellington Hampshire, MD   100 mg at 06/19/19 0959  . multivitamin with minerals tablet 1 tablet  1 tablet Oral Daily Ivor Costa, MD   1 tablet at 06/19/19 0959  . ondansetron (ZOFRAN) tablet 4 mg  4 mg Oral Q6H PRN Ivor Costa, MD       Or  . ondansetron [facility]) injection 4 mg  4 mg Intravenous Q6H PRN Ivor Costa, MD      . oxyCODONE (Oxy IR/ROXICODONE) immediate release tablet 5 mg  5 mg Oral Q4H PRN Ralene Muskrat B, MD   5 mg at 06/18/19 1158  . polyethylene glycol (MIRALAX / GLYCOLAX) packet 17 g  17 g Oral Daily PRN Ivor Costa, MD   17 g at 06/17/19 [id number]  . sodium chloride flush (NS) 0.9 % injection 10-40 mL  10-40 mL  Intracatheter Q12H Jennye Boroughs, MD   10 mL at 06/19/19 0959  . sodium chloride flush (NS) 0.9 % injection 10-40 mL  10-40 mL Intracatheter PRN Jennye Boroughs, MD         Discharge Medications: Please see discharge summary for a list of discharge medications.  Relevant Imaging Results:  Relevant Lab Results:   Additional Information SSN [id number]  Ross Ludwig, LCSW

## 2019-06-19 NOTE — Progress Notes (Signed)
Progress Note    Adysson Warhurst  [id number] DOB: [birth year]  DOA: 06/15/2019 PCP: Patient, No Pcp Per       Assessment/Plan:   Principal Problem:   Acute systolic CHF (congestive heart failure) (HCC) Active Problems:   Hypertension   Lymphoma (HCC)   Elevated troponin   Hypokalemia   Lactic acidosis   Sinus tachycardia   Near syncope   Protein-calorie malnutrition, severe   Body mass index is 27.46 kg/m.    Acute systolic heart failure: EF estimated at 30%.  Cardiologist recommended continuation of metoprolol but discontinuation of Entresto because of borderline low blood pressure.  Sinus tachycardia with PACs: Continue metoprolol. Continue to monitor on telemetry.  Elevated troponin: Likely from demand ischemia.  Hypokalemia: Improved  Recent near syncope: Likely multifactorial.  Patient appears stable.  Heart rate went up when she worked with PT.  Lymphoma: Follow-up with oncologist at [facility] for chemotherapy.  Severe protein calorie malnutrition: Continue nutritional supplements.  Follow-up with dietitian.  Left knee osteoarthritis: PT recommends discharge to SNF.  Follow-up with social worker to assist with disposition.   Family Communication/Anticipated D/C date and plan/Code Status   DVT prophylaxis: Heparin Code Status: Full code Family Communication: Plan discussed with the patient  disposition Plan: Possible discharge to SNF tomorrow      Subjective:   No new complaints.  She still has some pain in the left knee.  No shortness of breath, chest pain, dizziness or palpitations.   Objective:    Vitals:   06/19/19 0957 06/19/19 1402 06/19/19 1413 06/19/19 1655  BP: (!) 103/55 110/78  (!) 98/56  Pulse: (!) 105 (!) 109  88  Resp:  (!) 22 19 18   Temp:    98 F (36.7 C)  TempSrc:    Oral  SpO2:    100%  Weight:      Height:        Intake/Output Summary (Last 24 hours) at 06/19/2019 1702 Last data filed at 06/19/2019 1345 Gross per  24 hour  Intake 130 ml  Output 675 ml  Net -545 ml   Filed Weights   06/15/19 0742  Weight: 72.6 kg    Exam:  GEN: NAD SKIN: No rash EYES: No pallor or icterus ENT: MMM CV: Regular rate but tachycardic PULM: CTA B ABD: soft, ND, NT, +BS CNS: AAO x 3, non focal EXT: No edema or tenderness MSK: Left knee without swelling or tenderness. It's slightly tender     Data Reviewed:   I have personally reviewed following labs and imaging studies:  Labs: Labs show the following:   Basic Metabolic Panel: Recent Labs  Lab 06/15/19 0746 06/16/19 0638 06/17/19 0620 06/18/19 0505  NA 143 143 141 141  K 3.0* 3.5 3.7 3.8  CL 104 109 108 110  CO2 28 26 24 26   GLUCOSE 139* 95 110* 113*  BUN 11 8 7* 11  CREATININE 0.71 0.69 0.64 0.63  CALCIUM 9.4 8.7* 8.7* 8.8*  MG 1.8  --  2.0  --    GFR Estimated Creatinine Clearance: 56.6 mL/min (by C-G formula based on SCr of 0.63 mg/dL). Liver Function Tests: Recent Labs  Lab 06/15/19 0746  AST 38  ALT 22  ALKPHOS 64  BILITOT 1.0  PROT 6.0*  ALBUMIN 3.6   No results for input(s): LIPASE, AMYLASE in the last 168 hours. No results for input(s): AMMONIA in the last 168 hours. Coagulation profile No results for input(s): INR, PROTIME in the  last 168 hours.  CBC: Recent Labs  Lab 06/15/19 0746 06/16/19 0638  WBC 16.7* 12.0*  NEUTROABS 11.3*  --   HGB 11.4* 9.7*  HCT 35.0* 31.6*  MCV 90.9 95.8  PLT 279 246   Cardiac Enzymes: No results for input(s): CKTOTAL, CKMB, CKMBINDEX, TROPONINI in the last 168 hours. BNP (last 3 results) No results for input(s): PROBNP in the last 8760 hours. CBG: Recent Labs  Lab 06/16/19 0931  GLUCAP 78   D-Dimer: No results for input(s): DDIMER in the last 72 hours. Hgb A1c: No results for input(s): HGBA1C in the last 72 hours. Lipid Profile: No results for input(s): CHOL, HDL, LDLCALC, TRIG, CHOLHDL, LDLDIRECT in the last 72 hours. Thyroid function studies: No results for input(s):  TSH, T4TOTAL, T3FREE, THYROIDAB in the last 72 hours.  Invalid input(s): FREET3 Anemia work up: No results for input(s): VITAMINB12, FOLATE, FERRITIN, TIBC, IRON, RETICCTPCT in the last 72 hours. Sepsis Labs: Recent Labs  Lab 06/15/19 0746 06/15/19 1006 06/15/19 1101 06/16/19 0638  PROCALCITON  --   --  0.23  --   WBC 16.7*  --   --  12.0*  LATICACIDVEN 2.3* 1.7  --   --     Microbiology Recent Results (from the past 240 hour(s))  Culture, blood (x 2)     Status: None (Preliminary result)   Collection Time: 06/15/19  7:46 AM   Specimen: BLOOD  Result Value Ref Range Status   Specimen Description BLOOD RAC  Final   Special Requests   Final    BOTTLES DRAWN AEROBIC AND ANAEROBIC Blood Culture adequate volume   Culture   Final    NO GROWTH 4 DAYS Performed at [facility], [address] [place], [place] [address]    Report Status PENDING  Incomplete  SARS CORONAVIRUS 2 (TAT 6-24 HRS) Nasopharyngeal Nasopharyngeal Swab     Status: None   Collection Time: 06/15/19  8:30 AM   Specimen: Nasopharyngeal Swab  Result Value Ref Range Status   SARS Coronavirus 2 NEGATIVE NEGATIVE Final    Comment: (NOTE) SARS-CoV-2 target nucleic acids are NOT DETECTED. The SARS-CoV-2 RNA is generally detectable in upper and lower respiratory specimens during the acute phase of infection. Negative results do not preclude SARS-CoV-2 infection, do not rule out co-infections with other pathogens, and should not be used as the sole basis for treatment or other patient management decisions. Negative results must be combined with clinical observations, patient history, and epidemiological information. The expected result is Negative. Fact Sheet for Patients: SugarRoll.be Fact Sheet for Healthcare Providers: https://www.woods-mathews.com/ This test is not yet approved or cleared by the [place] FDA and  has been authorized for detection and/or  diagnosis of SARS-CoV-2 by FDA under an Emergency Use Authorization (EUA). This EUA will remain  in effect (meaning this test can be used) for the duration of the COVID-19 declaration under Section 56 4(b)(1) of the Act, 21 U.S.C. section 360bbb-3(b)(1), unless the authorization is terminated or revoked sooner. Performed at [facility], [place] [address], [place], [place] [address]   Urine culture     Status: Abnormal   Collection Time: 06/15/19  8:55 AM   Specimen: Urine, Random  Result Value Ref Range Status   Specimen Description   Final    URINE, RANDOM Performed at [facility], [address] [place], [place] [address]    Special Requests   Final    NONE Performed at [facility], [address] [place], [place] [address]    Culture (A)  Final    <10,000 COLONIES/mL INSIGNIFICANT GROWTH Performed at [place] [address] [place], [place] [address]    Report Status 06/17/2019 FINAL  Final  Culture, blood (x 2)     Status: None (Preliminary result)   Collection Time: 06/15/19 11:13 AM   Specimen: BLOOD  Result Value Ref Range Status   Specimen Description BLOOD [facility]  Final   Special Requests   Final    BOTTLES DRAWN AEROBIC AND ANAEROBIC Blood Culture results may not be optimal due to an inadequate volume of blood received in culture bottles   Culture   Final    NO GROWTH 4 DAYS Performed at [facility], [address], [place],  [address]    Report Status PENDING  Incomplete    Procedures and diagnostic studies:  No results found.  Medications:   . aspirin EC  81 mg Oral Daily  . Chlorhexidine Gluconate Cloth  6 each Topical Daily  . feeding supplement (ENSURE ENLIVE)  237 mL Oral TID BM  . heparin  5,000 Units Subcutaneous Q8H  . loratadine  10 mg Oral Daily  . metoprolol tartrate  100 mg Oral BID  . multivitamin with minerals  1 tablet Oral Daily  . sodium chloride flush  10-40 mL Intracatheter Q12H    Continuous Infusions:   LOS: 4 days   Mailee Klaas  [facility]   *Please refer to [email or web address], password [email or web address] to get updated schedule on who will round on this patient, as hospitalists switch teams weekly. If 7PM-7AM, please contact night-coverage at [email or web address], password [email or web address] for any overnight needs.  06/19/2019, 5:02 PM

## 2019-06-19 NOTE — Progress Notes (Signed)
Occupational Therapy Treatment Patient Details Name: Christie Farmer MRN: [id number] DOB: 1941-01-23 Today's Date: 06/19/2019    History of present illness Pt is 78 y/o F who presented to ER secondary to progressive weakness, lightheadedness and dizziness; admitted for management of near syncope, sinus tachycardia with frequent PACs.  Noted with mild elevation in troponin, likely demand ischemia per notes.   OT comments  Pt stated she felt sleepy upon arrival of therapist but was agreeable to tx and daughter Christie Farmer present. BP lying was 118/67 and pulse 90 and O2 sats 100%.  Sitting at EOB BP was 110/78 and pulse 109 and sats 100% and after trying to stand briefly BP was 115/69 and pulse 112 and O2 sats 100%.  Set up only needed for grooming skills sitting at EOB and no c/o dizziness sitting or after trying to stand up.  Mod assist to try to stand and unable to clear bottom off of bed more than a few inches with asssit and DC plan changed to SNF vs HH OT to help regain as much strength and independence in ADLs as possible before going home again with her daughter. Pt is motivated to regain independence again and do as much for herself as possible  Follow Up Recommendations  SNF    Equipment Recommendations       Recommendations for Other Services      Precautions / Restrictions Precautions Precautions: Fall Precaution Comments: R chest port Restrictions Weight Bearing Restrictions: No Other Position/Activity Restrictions: BP taken supine and sitting for fxl tasks. 118/67 supine and 110/78 in EOB sitting. No evidence of orthostatics today-pt does not c/o dizziness sitting EOB this date.       Mobility Bed Mobility                  Transfers                      Balance                                           ADL either performed or assessed with clinical judgement   ADL Overall ADL's : Needs assistance/impaired     Grooming: Wash/dry  face;Oral care;Set up;Sitting                                 General ADL Comments: Pt stated she felt sleepy upon arrival of therapist but was agreeable to tx and daughter Christie Farmer present.  Pt with stable BP overall going from lying to siting at EOB and trying to stand briefly.  Set up only needed for grooming skills sitting at EOB and no c/o dizziness sitting or after trying to stand up.  Mod assist to try to stand and unable to clear bottom off of bed more than a few inches with asssit and DC plan changed to SNF vs HH OT. Pt is motivated to regain indpendence again and do as much for herself as possible.     Vision Patient Visual Report: No change from baseline     Perception     Praxis      Cognition Arousal/Alertness: Awake/alert Behavior During Therapy: WFL for tasks assessed/performed Overall Cognitive Status: Within Functional Limits for tasks assessed  Exercises     Shoulder Instructions       General Comments      Pertinent Vitals/ Pain       Pain Assessment: No/denies pain  Home Living                                          Prior Functioning/Environment              Frequency  Min 1X/week        Progress Toward Goals  OT Goals(current goals can now be found in the care plan section)  Progress towards OT goals: Progressing toward goals  Acute Rehab OT Goals Patient Stated Goal: to get my strength back OT Goal Formulation: With patient/family Time For Goal Achievement: 06/30/19 Potential to Achieve Goals: Good  Plan Discharge plan needs to be updated    Co-evaluation                 AM-PAC OT "6 Clicks" Daily Activity     Outcome Measure   Help from another person eating meals?: None Help from another person taking care of personal grooming?: None Help from another person toileting, which includes using toliet, bedpan, or urinal?: A  Little Help from another person bathing (including washing, rinsing, drying)?: A Lot Help from another person to put on and taking off regular upper body clothing?: A Little Help from another person to put on and taking off regular lower body clothing?: A Lot 6 Click Score: 18    End of Session Equipment Utilized During Treatment: Gait belt;Rolling walker  OT Visit Diagnosis: Unsteadiness on feet (R26.81);Muscle weakness (generalized) (M62.81);History of falling (Z91.81)   Activity Tolerance Patient tolerated treatment well   Patient Left in bed;with call bell/phone within reach;with family/visitor present   Nurse Communication          Time: 1332-1400 OT Time Calculation (min): 28 min  Charges: OT General Charges $OT Visit: 1 Visit OT Treatments $Self Care/Home Management : 23-37 mins  Chrys Racer, OTR/L, [place] ascom [phone] 06/19/19, 2:12 PM

## 2019-06-19 NOTE — Progress Notes (Signed)
Physical Therapy Treatment Patient Details Name: Christie Farmer MRN: [id number] DOB: [birth year] Today's Date: 06/19/2019    History of Present Illness Pt is 78 y/o F who presented to ER secondary to progressive weakness, lightheadedness and dizziness; admitted for management of near syncope, sinus tachycardia with frequent PACs.  Noted with mild elevation in troponin, likely demand ischemia per notes.    PT Comments    Pt presented with deficits in strength, transfers, mobility, gait, balance, and activity tolerance.  Pt able to perform sup to/from sit without physical assistance but continued to use her UEs to assist her LLE in/out of bed.  Pt was able to stand with assistance with significant effort but could only take several very small steps at the EOB heavy lean on the RW and min A for stability before requiring to return to sitting.  Pt reported minor dizziness after amb that resolved quickly upon return to sitting.  Pt will benefit from PT services in a SNF setting upon discharge to safely address above deficits for decreased caregiver assistance and eventual return to PLOF.      Follow Up Recommendations  SNF     Equipment Recommendations  None recommended by PT    Recommendations for Other Services       Precautions / Restrictions Precautions Precautions: Fall Precaution Comments: R chest port Restrictions Weight Bearing Restrictions: No Other Position/Activity Restrictions:    Mobility  Bed Mobility Overal bed mobility: Needs Assistance Bed Mobility: Supine to Sit;Sit to Supine     Supine to sit: Supervision Sit to supine: Supervision   General bed mobility comments: Extra time and effort with pt using BUEs to assist her LLE out of bed  Transfers   Equipment used: Rolling walker (2 wheeled) Transfers: Sit to/from Stand Sit to Stand: Min assist;From elevated surface         General transfer comment: Min A to stand from an elevated  EOB  Ambulation/Gait Ambulation/Gait assistance: Min assist Gait Distance (Feet): 2 Feet Assistive device: Rolling walker (2 wheeled) Gait Pattern/deviations: Step-to pattern;Antalgic;Decreased stance time - left;Decreased step length - right Gait velocity: decreased   General Gait Details: Antalgic gait on the LLE with min A to prevent LOB; HR to 118 with limited amb up from 90s at rest.   Stairs             Wheelchair Mobility    Modified Rankin (Stroke Patients Only)       Balance Overall balance assessment: Needs assistance Sitting-balance support: No upper extremity supported;Feet supported Sitting balance-Leahy Scale: Good     Standing balance support: Bilateral upper extremity supported;During functional activity Standing balance-Leahy Scale: Poor Standing balance comment: Min A for stability during functional activity in standing                            Cognition Arousal/Alertness: Awake/alert Behavior During Therapy: WFL for tasks assessed/performed Overall Cognitive Status: Within Functional Limits for tasks assessed                                        Exercises Total Joint Exercises Ankle Circles/Pumps: Strengthening;Both;10 reps;5 reps Quad Sets: Strengthening;Both;10 reps;5 reps Gluteal Sets: Strengthening;Both;10 reps Towel Squeeze: Strengthening;Both;10 reps Heel Slides: AROM;Both;10 reps Hip ABduction/ADduction: AROM;AAROM;Both;10 reps Long Arc Quad: Strengthening;Both;10 reps;15 reps Knee Flexion: Strengthening;Both;10 reps;15 reps Bridges: AROM;Both;5 reps(low amplitude) Other Exercises Other  Exercises: HEP education and review with pt and daughter for BLE APs, QS, and GS x 10 each every 1-2 hours daily    General Comments        Pertinent Vitals/Pain Pain Assessment: No/denies pain    Home Living                      Prior Function            PT Goals (current goals can now be found  in the care plan section) Acute Rehab PT Goals Patient Stated Goal: to get my strength back Progress towards PT goals: Progressing toward goals    Frequency    Min 2X/week      PT Plan Discharge plan needs to be updated    Co-evaluation              AM-PAC PT "6 Clicks" Mobility   Outcome Measure  Help needed turning from your back to your side while in a flat bed without using bedrails?: A Little Help needed moving from lying on your back to sitting on the side of a flat bed without using bedrails?: A Little Help needed moving to and from a bed to a chair (including a wheelchair)?: A Lot Help needed standing up from a chair using your arms (e.g., wheelchair or bedside chair)?: A Lot Help needed to walk in hospital room?: Total Help needed climbing 3-5 steps with a railing? : Total 6 Click Score: 12    End of Session Equipment Utilized During Treatment: Gait belt Activity Tolerance: Patient tolerated treatment well Patient left: in bed;with call bell/phone within reach;with bed alarm set;with family/visitor present Nurse Communication: Mobility status PT Visit Diagnosis: Muscle weakness (generalized) (M62.81);Difficulty in walking, not elsewhere classified (R26.2)     Time: [id number] PT Time Calculation (min) (ACUTE ONLY): 26 min  Charges:  $Therapeutic Exercise: 23-37 mins                     D. Scott Mivaan Corbitt PT, DPT 06/19/19, 4:07 PM

## 2019-06-20 ENCOUNTER — Telehealth: Payer: Self-pay | Admitting: *Deleted

## 2019-06-20 LAB — CULTURE, BLOOD (ROUTINE X 2)
Culture: NO GROWTH
Culture: NO GROWTH
Special Requests: ADEQUATE

## 2019-06-20 MED ORDER — FAMOTIDINE 20 MG PO TABS
20.0000 mg | ORAL_TABLET | Freq: Once | ORAL | Status: AC
  Administered 2019-06-20: 20 mg via ORAL
  Filled 2019-06-20: qty 1

## 2019-06-20 NOTE — Telephone Encounter (Signed)
Patient admitted

## 2019-06-20 NOTE — Telephone Encounter (Signed)
-----   Message from Wellington Hampshire, MD sent at 06/18/2019 10:47 AM EST ----- Possible discharge home today or tomorrow from [facility] for acute systolic heart failure. TCM follow-up needed with Dr. Candis Musa or APP in 1 week.

## 2019-06-20 NOTE — Care Management Important Message (Signed)
Important Message  Patient Details  Name: Christie Farmer MRN: [id number] Date of Birth: July 28, 1940   Medicare Important Message Given:  Yes     Dannette Barbara 06/20/2019, 11:18 AM

## 2019-06-20 NOTE — TOC Transition Note (Signed)
Transition of Care [facility]) - CM/SW Discharge Note   Patient Details  Name: Christie Farmer MRN: [id number] Date of Birth: 03/15/1941  Transition of Care [facility]) CM/SW Contact:  Ross Ludwig, LCSW Phone Number: 06/20/2019, 12:24 PM   Clinical Narrative:     Patient to be d/c'ed today to [facility] room 801.  Patient and family agreeable to plans will transport via ems RN to call report to [phone].  Daughter is at bedside and is aware that patient will be discharging today.     Final next level of care: Skilled Nursing Facility Barriers to Discharge: Barriers Resolved   Patient Goals and CMS Choice Patient states their goals for this hospitalization and ongoing recovery are:: To return back home after getting some short term rehab. CMS Medicare.gov Compare Post Acute Care list provided to:: Patient Choice offered to / list presented to : Patient  Discharge Placement PASRR number recieved: 06/18/19            Patient chooses bed at: [facility] Patient to be transferred to facility by: [facility] Name of family member notified: Patient's daughter [name] [phone] was at bedside and is aware that patient is discharging today. Patient and family notified of of transfer: 06/20/19  Discharge Plan and Services                DME Arranged: N/A           HH Agency: NA        Social Determinants of Health (SDOH) Interventions     Readmission Risk Interventions No flowsheet data found.

## 2019-06-20 NOTE — Discharge Summary (Addendum)
Physician Discharge Summary  Christie Farmer [id number] DOB: September 25, 1940 DOA: 06/15/2019  PCP: Patient, No Pcp Per  Admit date: 06/15/2019 Discharge date: 06/20/2019  Discharge disposition: Skilled nursing facility  Recommendations for Outpatient Follow-Up:   Follow-up with cardiologist as scheduled   Discharge Diagnosis:   Principal Problem:   Acute systolic CHF (congestive heart failure) ([place]) Active Problems:   Hypertension   Lymphoma ([place])   Elevated troponin   Hypokalemia   Lactic acidosis   Sinus tachycardia   Near syncope   Protein-calorie malnutrition, severe    Discharge Condition: Stable.  Diet recommendation: Low-salt diet  Code status: Full code.    Hospital Course:   Ms. Christie Farmer with medical history significant for hypertension, GERD, lymphoma on chemotherapy at [facility] presented to the hospital with generalized weakness, dizziness and near syncope.  She also complained of pain in her left knee.  She said she had fallen at home which resulted in left knee pain.  She was found to have tachycardia in the ED and she was initially placed on IV Cardizem infusion for suspected atrial fibrillation with RVR.  She was evaluated by cardiologist who felt that patient did not have atrial fibrillation but rather sinus tachycardia with frequent PACs.  2D echo showed EF estimated at 30%.  It was unknown how long patient has had this low ejection fraction but it was suspected that she has acute systolic heart failure.  She was treated with beta-blocker.  Heart rate has improved but she still has occasional tachycardia with activity.  She was started on Entresto but this was discontinued because of hypotension.  Patient also has severe protein calorie malnutrition and use of nutritional supplements is encouraged.  X-ray of the left knee showed osteoarthritis.  She was evaluated by PT and OT who recommended further rehabilitation at a skilled nursing facility.  From  iststandpoint, patient is stable for discharge.  Medical Consultants:    Cardiology   Discharge Exam:   Vitals:   06/20/19 0429 06/20/19 0759  BP: 113/68 109/81  Pulse: 87 93  Resp: 20 18  Temp: 97.6 F (36.4 C) 98.8 F (37.1 C)  SpO2: 100% 100%   Vitals:   06/19/19 2253 06/19/19 2256 06/20/19 0429 06/20/19 0759  BP: 98/60 (!) 98/52 113/68 109/81  Pulse: 94 99 87 93  Resp:   20 18  Temp:   97.6 F (36.4 C) 98.8 F (37.1 C)  TempSrc:   Oral Oral  SpO2: 100% 100% 100% 100%  Weight:      Height:         GEN: NAD SKIN: No rash EYES: EOMI ENT: MMM CV: RRR PULM: CTA B ABD: soft, ND, NT, +BS CNS: AAO x 3, non focal EXT: No edema or tenderness MSK: No swelling, tenderness or erythema of the left knee   The results of significant diagnostics from this hospitalization (including imaging, microbiology, ancillary and laboratory) are listed below for reference.     Procedures and Diagnostic Studies:   Dg Knee 2 Views Left  Result Date: 06/15/2019 CLINICAL DATA:  Left knee pain EXAM: LEFT KNEE - 1-2 VIEW COMPARISON:  None. FINDINGS: No acute fracture or dislocation or joint effusion is identified. Narrow femoral tibial joint space with osteophyte formation are noted. Chondrocalcinosis is identified in the femoral tibial joint. IMPRESSION: No acute fracture or dislocation. Osteoarthritic changes of left knee. Electronically Signed   By: Abelardo Diesel M.D.   On: 06/15/2019 09:05   Dg Chest Portable 1 View  Result Date: 06/15/2019 CLINICAL DATA:  Generalized weakness EXAM: PORTABLE CHEST 1 VIEW COMPARISON:  None. FINDINGS: Right chest wall port catheter tip is near the cavoatrial junction. Low lung volumes. No focal consolidation or edema. No pleural effusion or pneumothorax. Normal heart size. Calcification is present along the aortic arch. IMPRESSION: No acute process in the chest. Electronically Signed   By: Macy Mis M.D.   On: 06/15/2019 08:16     Labs:    Basic Metabolic Panel: Recent Labs  Lab 06/15/19 0746 06/16/19 0638 06/17/19 0620 06/18/19 0505  NA 143 143 141 141  K 3.0* 3.5 3.7 3.8  CL 104 109 108 110  CO2 28 26 24 26   GLUCOSE 139* 95 110* 113*  BUN 11 8 7* 11  CREATININE 0.71 0.69 0.64 0.63  CALCIUM 9.4 8.7* 8.7* 8.8*  MG 1.8  --  2.0  --    GFR Estimated Creatinine Clearance: 56.6 mL/min (by C-G formula based on SCr of 0.63 mg/dL). Liver Function Tests: Recent Labs  Lab 06/15/19 0746  AST 38  ALT 22  ALKPHOS 64  BILITOT 1.0  PROT 6.0*  ALBUMIN 3.6   No results for input(s): LIPASE, AMYLASE in the last 168 hours. No results for input(s): AMMONIA in the last 168 hours. Coagulation profile No results for input(s): INR, PROTIME in the last 168 hours.  CBC: Recent Labs  Lab 06/15/19 0746 06/16/19 0638  WBC 16.7* 12.0*  NEUTROABS 11.3*  --   HGB 11.4* 9.7*  HCT 35.0* 31.6*  MCV 90.9 95.8  PLT 279 246   Cardiac Enzymes: No results for input(s): CKTOTAL, CKMB, CKMBINDEX, TROPONINI in the last 168 hours. BNP: Invalid input(s): POCBNP CBG: Recent Labs  Lab 06/16/19 0931  GLUCAP 78   D-Dimer No results for input(s): DDIMER in the last 72 hours. Hgb A1c No results for input(s): HGBA1C in the last 72 hours. Lipid Profile No results for input(s): CHOL, HDL, LDLCALC, TRIG, CHOLHDL, LDLDIRECT in the last 72 hours. Thyroid function studies No results for input(s): TSH, T4TOTAL, T3FREE, THYROIDAB in the last 72 hours.  Invalid input(s): FREET3 Anemia work up No results for input(s): VITAMINB12, FOLATE, FERRITIN, TIBC, IRON, RETICCTPCT in the last 72 hours. Microbiology Recent Results (from the past 240 hour(s))  Culture, blood (x 2)     Status: None   Collection Time: 06/15/19  7:46 AM   Specimen: BLOOD  Result Value Ref Range Status   Specimen Description BLOOD RAC  Final   Special Requests   Final    BOTTLES DRAWN AEROBIC AND ANAEROBIC Blood Culture adequate volume   Culture   Final    NO GROWTH  5 DAYS Performed at [facility], [address] [place], [place] [address]    Report Status 06/20/2019 FINAL  Final  SARS CORONAVIRUS 2 (TAT 6-24 HRS) Nasopharyngeal Nasopharyngeal Swab     Status: None   Collection Time: 06/15/19  8:30 AM   Specimen: Nasopharyngeal Swab  Result Value Ref Range Status   SARS Coronavirus 2 NEGATIVE NEGATIVE Final    Comment: (NOTE) SARS-CoV-2 target nucleic acids are NOT DETECTED. The SARS-CoV-2 RNA is generally detectable in upper and lower respiratory specimens during the acute phase of infection. Negative results do not preclude SARS-CoV-2 infection, do not rule out co-infections with other pathogens, and should not be used as the sole basis for treatment or other patient management decisions. Negative results must be combined with clinical observations, patient history, and epidemiological information. The expected result is  Negative. Fact Sheet for Patients: SugarRoll.be Fact Sheet for Healthcare Providers: https://www.woods-mathews.com/ This test is not yet approved or cleared by the [place] FDA and  has been authorized for detection and/or diagnosis of SARS-CoV-2 by FDA under an Emergency Use Authorization (EUA). This EUA will remain  in effect (meaning this test can be used) for the duration of the COVID-19 declaration under Section 56 4(b)(1) of the Act, 21 U.S.C. section 360bbb-3(b)(1), unless the authorization is terminated or revoked sooner. Performed at [facility], [place] [address], [place], [place] [address]   Urine culture     Status: Abnormal   Collection Time: 06/15/19  8:55 AM   Specimen: Urine, Random  Result Value Ref Range Status   Specimen Description   Final    URINE, RANDOM Performed at [facility], [address], [place], [place] [address]    Special Requests   Final    NONE Performed at [facility], [address]  [place], [place] [address]    Culture (A)  Final    <10,000 COLONIES/mL INSIGNIFICANT GROWTH Performed at [facility], [place] [address], [place], [place] [address]    Report Status 06/17/2019 FINAL  Final  Culture, blood (x 2)     Status: None   Collection Time: 06/15/19 11:13 AM   Specimen: BLOOD  Result Value Ref Range Status   Specimen Description BLOOD [facility]  Final   Special Requests   Final    BOTTLES DRAWN AEROBIC AND ANAEROBIC Blood Culture results may not be optimal due to an inadequate volume of blood received in culture bottles   Culture   Final    NO GROWTH 5 DAYS Performed at [facility], [address], [place], [place] [address]    Report Status 06/20/2019 FINAL  Final  SARS CORONAVIRUS 2 (TAT 6-24 HRS) Nasopharyngeal Nasopharyngeal Swab     Status: None   Collection Time: 06/19/19  1:26 PM   Specimen: Nasopharyngeal Swab  Result Value Ref Range Status   SARS Coronavirus 2 NEGATIVE NEGATIVE Final    Comment: (NOTE) SARS-CoV-2 target nucleic acids are NOT DETECTED. The SARS-CoV-2 RNA is generally detectable in upper and lower respiratory specimens during the acute phase of infection. Negative results do not preclude SARS-CoV-2 infection, do not rule out co-infections with other pathogens, and should not be used as the sole basis for treatment or other patient management decisions. Negative results must be combined with clinical observations, patient history, and epidemiological information. The expected result is Negative. Fact Sheet for Patients: SugarRoll.be Fact Sheet for Healthcare Providers: https://www.woods-mathews.com/ This test is not yet approved or cleared by the [place] FDA and  has been authorized for detection and/or diagnosis of SARS-CoV-2 by FDA under an Emergency Use Authorization (EUA). This EUA will remain  in effect (meaning this test can be used) for the duration of the COVID-19  declaration under Section 56 4(b)(1) of the Act, 21 U.S.C. section 360bbb-3(b)(1), unless the authorization is terminated or revoked sooner. Performed at [facility], [place] [address] [place], [place] [address]      Discharge Instructions:   Discharge Instructions    AMB referral to CHF clinic   Complete by: As directed    Diet - low sodium heart healthy   Complete by: As directed    Increase activity slowly   Complete by: As directed    Increase activity slowly   Complete by: As directed  Allergies as of 06/20/2019   No Known Allergies     Medication List    TAKE these medications   aspirin 81 MG EC tablet Take 1 tablet (81 mg total) by mouth daily.   dronabinol 2.5 MG capsule Commonly known as: MARINOL Take 2.5 mg by mouth daily as needed (appetite).   famotidine 20 MG tablet Commonly known as: PEPCID Take 20 mg by mouth 2 (two) times daily.   feeding supplement (ENSURE ENLIVE) Liqd Take 237 mLs by mouth 2 (two) times daily between meals.   loratadine 10 MG tablet Commonly known as: CLARITIN Take 10 mg by mouth daily.   metoprolol succinate 100 MG 24 hr tablet Commonly known as: Toprol XL Take 1 tablet (100 mg total) by mouth daily. Take with or immediately following a meal.   multivitamin with minerals Tabs tablet Take 1 tablet by mouth daily.   polyethylene glycol 17 g packet Commonly known as: MIRALAX / GLYCOLAX Take 17 g by mouth daily.   senna 8.6 MG Tabs tablet Commonly known as: SENOKOT Take 1 tablet (8.6 mg total) by mouth at bedtime.   traMADol 50 MG tablet Commonly known as: ULTRAM Take 50 mg by mouth 3 (three) times daily as needed for moderate pain.       Contact information for follow-up providers    Kate Sable, MD Follow up in 1 week(s).   Specialties: Cardiology, Radiology Contact information: [place] [address] [phone]        [facility] Follow up on 06/27/2019.   Specialty: Cardiology Why: at 1:30pm. Enter through the [place] entrance Contact information: [place] [phone]           Contact information for after-discharge care    Destination    [facility] Preferred [facility] .   Service: Skilled Nursing Contact information: [address] [place] [phone]                   Time coordinating discharge: 28 minutes  Signed:  Jennye Boroughs  [facility] 06/20/2019, 10:58 AM

## 2019-06-21 NOTE — Telephone Encounter (Signed)
Spoke with patient's daughter. Patient was discharged to [facility]. Patient has appointment on 06/27/19 with Darylene Price, NP. Patient is being mailed a ZIO monitor. Per website it should be arriving today. Daughter will take the monitor to the facility to have it applied.  Went ahead and scheduled an appt with APP for a few weeks out to give enough time to get the ZIO monitor results back for the appt. The daughter was very Patent attorney.

## 2019-06-25 ENCOUNTER — Telehealth: Payer: Self-pay | Admitting: Cardiovascular Disease

## 2019-06-25 NOTE — Telephone Encounter (Signed)
-----   Message from [place] Hampshire, MD sent at 06/18/2019 10:47 AM EST ----- Possible discharge home today or tomorrow from [facility] for acute systolic heart failure. TCM follow-up needed with Dr. Candis Musa or APP in 1 week.

## 2019-06-25 NOTE — Telephone Encounter (Signed)
Called [facility] to [facility] and spoke with [facility] LPN. Reviewed that patient has order for monitor which should be in a box with patient. Confirmed both scheduled appointments as follows: Darylene Price 06/27/19 & Ryan Dunn 07/19/2018. She did request that we fax over order for placement of monitor. Fax # [phone] Attention Kim. Will have provider sign order form and fax over to their facility. Will also fax appointment information with order.

## 2019-06-25 NOTE — Telephone Encounter (Signed)
Reviewed patient information with Christie Farmer here in clinic regarding appointments and order needed for facility. He confirmed those dates would be acceptable and I will fax order over to [facility].

## 2019-06-25 NOTE — Telephone Encounter (Signed)
Spoke with Daughter unable to discuss details as no dpr on file   TCM....  Patient is being discharged      They are scheduled to see Thurmond Butts 07/19/18 at 60 am   They were seen for acute systolic HF  They need to be seen within 1 week of 12/8     Per Daughter patient has received zio but facility unable to apply without an order.  Please call [facility] [phone] to give order for zio Please advise scheduling if appt should stay 1/8 or be moved up ( tcm vs zio report )

## 2019-06-26 ENCOUNTER — Ambulatory Visit (INDEPENDENT_AMBULATORY_CARE_PROVIDER_SITE_OTHER): Payer: Medicare Other

## 2019-06-26 DIAGNOSIS — R002 Palpitations: Secondary | ICD-10-CM | POA: Diagnosis not present

## 2019-06-26 NOTE — Progress Notes (Signed)
Patient ID: Christie Farmer, female    DOB: 15-Dec-1940, 78 y.o.   MRN: [id number]  HPI  Christie Farmer is a 78 y/o female with a history of HTN, lymphoma, sinus tachycardia and chronic heart failure.   Echo report from 06/15/2019 reviewed and showed an EF of 30% with normal PA pressure.   Admitted 06/15/2019 due to acute HF. Cardiology consult obtained. Was given IV cardizem initially. Developed hypotension with entresto usage. PT/OT recommended SNF placement for rehab. Discharged after 5 days.   She presents today for her initial visit with a chief complaint of moderate fatigue upon minimal exertion. She describes this as chronic in nature having been present for several years. She has associated palpitations and decreased appetite along with this. She denies any difficulty sleeping, dizziness, abdominal distention, pedal edema, chest pain, shortness of breath, cough or weight gain.   Currently has PT/OT working with her at [facility].   Past Medical History:  Diagnosis Date  . Cancer ([place])   . CHF (congestive heart failure) ([place])   . Hypertension    Past Surgical History:  Procedure Laterality Date  . ABDOMINAL HYSTERECTOMY     Family History  Problem Relation Age of Onset  . Cancer Brother    Social History   Tobacco Use  . Smoking status: Never Smoker  . Smokeless tobacco: Never Used  Substance Use Topics  . Alcohol use: Never   No Known Allergies Prior to Admission medications   Medication Sig Start Date End Date Taking? Authorizing Provider  aspirin EC 81 MG EC tablet Take 1 tablet (81 mg total) by mouth daily. 06/18/19  Yes Sreenath, Sudheer B, MD  dronabinol (MARINOL) 2.5 MG capsule Take 2.5 mg by mouth daily as needed (appetite).   Yes [provider]  famotidine (PEPCID) 20 MG tablet Take 20 mg by mouth 2 (two) times daily.   Yes [provider]  feeding supplement, ENSURE ENLIVE, (ENSURE ENLIVE) LIQD Take 237 mLs by mouth 2 (two) times daily  between meals. 03/06/19  Yes Christie Lighter, MD  loratadine (CLARITIN) 10 MG tablet Take 10 mg by mouth daily.   Yes [provider]  metoprolol succinate (TOPROL XL) 100 MG 24 hr tablet Take 1 tablet (100 mg total) by mouth daily. Take with or immediately following a meal. 06/17/19 06/16/20 Yes Sreenath, Sudheer B, MD  Multiple Vitamin (MULTIVITAMIN WITH MINERALS) TABS tablet Take 1 tablet by mouth daily.   Yes [provider]  polyethylene glycol (MIRALAX / GLYCOLAX) 17 g packet Take 17 g by mouth daily. 03/07/19  Yes Christie Lighter, MD  senna (SENOKOT) 8.6 MG TABS tablet Take 1 tablet (8.6 mg total) by mouth at bedtime. 03/06/19  Yes Christie Lighter, MD  traMADol (ULTRAM) 50 MG tablet Take 50 mg by mouth 3 (three) times daily as needed for moderate pain.    Yes [provider]    Review of Systems  Constitutional: Positive for appetite change (decreased) and fatigue (easily).  HENT: Negative for congestion, postnasal drip and sore throat.   Eyes: Negative.   Respiratory: Negative for cough and shortness of breath.   Cardiovascular: Positive for palpitations. Negative for chest pain and leg swelling.  Gastrointestinal: Negative for abdominal distention and abdominal pain.  Endocrine: Negative.   Genitourinary: Negative.   Musculoskeletal: Positive for arthralgias (left leg).  Skin: Negative.   Allergic/Immunologic: Negative.   Neurological: Negative for dizziness and light-headedness.  Hematological: Negative for adenopathy. Does not bruise/bleed easily.  Psychiatric/Behavioral: Negative  for dysphoric mood and sleep disturbance (sleeping on 1 pillow). The patient is not nervous/anxious.     Vitals:   06/27/19 1309  BP: 95/65  Pulse: (!) 53  Resp: 15  SpO2: 100%  Height: 5\' 7"  (1.702 m)   Wt Readings from Last 3 Encounters:  06/15/19 160 lb (72.6 kg)  03/02/19 150 lb 9.2 oz (68.3 kg)   Lab Results  Component Value Date   CREATININE 0.63  06/18/2019   CREATININE 0.64 06/17/2019   CREATININE 0.69 06/16/2019    Physical Exam Vitals and nursing note reviewed.  HENT:     Head: Normocephalic and atraumatic.  Cardiovascular:     Rate and Rhythm: Regular rhythm. Bradycardia present.  Pulmonary:     Effort: Pulmonary effort is normal. No respiratory distress.     Breath sounds: No wheezing or rales.  Abdominal:     General: Abdomen is flat. There is no distension.     Palpations: Abdomen is soft.  Musculoskeletal:        General: No tenderness.     Cervical back: Normal range of motion and neck supple.     Right lower leg: Edema (trace pitting) present.     Left lower leg: Edema (trace pitting) present.  Skin:    General: Skin is warm and dry.  Neurological:     General: No focal deficit present.     Mental Status: She is alert and oriented to person, place, and time.  Psychiatric:        Mood and Affect: Mood normal.        Behavior: Behavior normal.        Thought Content: Thought content normal.    Assessment & Plan:  1: Chronic heart failure with reduced ejection fraction- - NYHA class III - euvolemic today - being weighed daily per facility order; order written for them to call for an overnight weight gain of >2 pounds or a weekly weight gain of >5 pounds - not adding salt to her food; low sodium information and cookbook given to patient for once she returns home - BP is unable to tolerate entresto - sees cardiology (Christie Farmer) 07/20/19 - BNP 06/15/2019 was 455.0 - receiving PT and OT at the facility  2: HTN- - BP on the low side today but she says that it's "always" low - seeing PCP at [facility] right now - BMP 06/18/2019 reviewed and showed sodium 141, potassium 3.8, creatinine 0.63 and creatinine >60  3: Palpitations- - patient wearing zio monitor - she doesn't notice the palpitations  4: Lymphoma- - saw hematologist Christie Farmer) 11/6/20202  Facility medication list reviewed.   Return here in 2  months or sooner for any questions/problems before then.

## 2019-06-27 ENCOUNTER — Ambulatory Visit: Payer: No Typology Code available for payment source | Attending: Family | Admitting: Family

## 2019-06-27 ENCOUNTER — Other Ambulatory Visit: Payer: Self-pay

## 2019-06-27 ENCOUNTER — Encounter: Payer: Self-pay | Admitting: Family

## 2019-06-27 VITALS — BP 95/65 | HR 53 | Resp 15 | Ht 67.0 in

## 2019-06-27 DIAGNOSIS — C8590 Non-Hodgkin lymphoma, unspecified, unspecified site: Secondary | ICD-10-CM

## 2019-06-27 DIAGNOSIS — Z79899 Other long term (current) drug therapy: Secondary | ICD-10-CM | POA: Diagnosis not present

## 2019-06-27 DIAGNOSIS — Z8572 Personal history of non-Hodgkin lymphomas: Secondary | ICD-10-CM | POA: Diagnosis not present

## 2019-06-27 DIAGNOSIS — R002 Palpitations: Secondary | ICD-10-CM | POA: Diagnosis not present

## 2019-06-27 DIAGNOSIS — R5383 Other fatigue: Secondary | ICD-10-CM | POA: Diagnosis present

## 2019-06-27 DIAGNOSIS — I110 Hypertensive heart disease with heart failure: Secondary | ICD-10-CM | POA: Insufficient documentation

## 2019-06-27 DIAGNOSIS — Z7982 Long term (current) use of aspirin: Secondary | ICD-10-CM | POA: Diagnosis not present

## 2019-06-27 DIAGNOSIS — I5022 Chronic systolic (congestive) heart failure: Secondary | ICD-10-CM | POA: Diagnosis not present

## 2019-06-27 DIAGNOSIS — I10 Essential (primary) hypertension: Secondary | ICD-10-CM

## 2019-06-27 NOTE — Patient Instructions (Signed)
Continue weighing daily and call for an overnight weight gain of > 2 pounds or a weekly weight gain of >5 pounds. 

## 2019-07-16 ENCOUNTER — Telehealth: Payer: Self-pay

## 2019-07-16 MED ORDER — MAGNESIUM SULFATE 2 GM/50ML IV SOLN
2.00 | INTRAVENOUS | Status: DC
Start: ? — End: 2019-07-16

## 2019-07-16 MED ORDER — FAMOTIDINE 20 MG PO TABS
20.00 | ORAL_TABLET | ORAL | Status: DC
Start: 2019-07-16 — End: 2019-07-16

## 2019-07-16 MED ORDER — DRONABINOL 2.5 MG PO CAPS
2.50 | ORAL_CAPSULE | ORAL | Status: DC
Start: ? — End: 2019-07-16

## 2019-07-16 MED ORDER — MELATONIN 3 MG PO TABS
3.00 | ORAL_TABLET | ORAL | Status: DC
Start: ? — End: 2019-07-16

## 2019-07-16 MED ORDER — SENNOSIDES 8.6 MG PO TABS
1.00 | ORAL_TABLET | ORAL | Status: DC
Start: 2019-07-16 — End: 2019-07-16

## 2019-07-16 MED ORDER — HYDROCORT-PRAMOXINE (PERIANAL) 1-1 % EX FOAM
1.00 | CUTANEOUS | Status: DC
Start: ? — End: 2019-07-16

## 2019-07-16 MED ORDER — POTASSIUM CHLORIDE CRYS ER 10 MEQ PO TBCR
40.00 | EXTENDED_RELEASE_TABLET | ORAL | Status: DC
Start: ? — End: 2019-07-16

## 2019-07-16 MED ORDER — POLYETHYLENE GLYCOL 3350 17 GM/SCOOP PO POWD
17.00 | ORAL | Status: DC
Start: 2019-07-17 — End: 2019-07-16

## 2019-07-16 MED ORDER — METOPROLOL SUCCINATE ER 50 MG PO TB24
100.00 | ORAL_TABLET | ORAL | Status: DC
Start: 2019-07-17 — End: 2019-07-16

## 2019-07-16 MED ORDER — POTASSIUM CHLORIDE CRYS ER 20 MEQ PO TBCR
60.00 | EXTENDED_RELEASE_TABLET | ORAL | Status: DC
Start: ? — End: 2019-07-16

## 2019-07-16 MED ORDER — ACETAMINOPHEN 500 MG PO TABS
1000.00 | ORAL_TABLET | ORAL | Status: DC
Start: 2019-07-16 — End: 2019-07-16

## 2019-07-16 MED ORDER — POTASSIUM CHLORIDE CRYS ER 10 MEQ PO TBCR
20.00 | EXTENDED_RELEASE_TABLET | ORAL | Status: DC
Start: ? — End: 2019-07-16

## 2019-07-16 MED ORDER — ONDANSETRON HCL 8 MG PO TABS
8.00 | ORAL_TABLET | ORAL | Status: DC
Start: 2019-07-17 — End: 2019-07-16

## 2019-07-16 MED ORDER — BISACODYL 10 MG RE SUPP
10.00 | RECTAL | Status: DC
Start: 2019-07-16 — End: 2019-07-16

## 2019-07-16 MED ORDER — OXYCODONE HCL 5 MG PO TABS
2.50 | ORAL_TABLET | ORAL | Status: DC
Start: ? — End: 2019-07-16

## 2019-07-16 MED ORDER — ALLOPURINOL 100 MG PO TABS
100.00 | ORAL_TABLET | ORAL | Status: DC
Start: 2019-07-17 — End: 2019-07-16

## 2019-07-16 MED ORDER — ENOXAPARIN SODIUM 40 MG/0.4ML ~~LOC~~ SOLN
40.00 | SUBCUTANEOUS | Status: DC
Start: 2019-07-16 — End: 2019-07-16

## 2019-07-16 NOTE — Progress Notes (Deleted)
Cardiology Office Note    Date:  07/16/2019   ID:  Adriana Mccallum, DOB Aug 01, 1940, MRN [id number]  PCP:  Patient, No Pcp Per  Cardiologist:  Ida Rogue, MD  Electrophysiologist:  None   Chief Complaint: Hospital follow-up  History of Present Illness:   Yenni Perlman is a 79 y.o. female with history of ***  ***   Labs independently reviewed: 07/2019 -Hgb 11.2, PLT 136, potassium 4.1, BUN 28, serum creatinine 0.69, albumin 2.7, AST normal, ALT 60, magnesium 2.1  Past Medical History:  Diagnosis Date  . Cancer ([place])   . CHF (congestive heart failure) ([place])   . Hypertension     Past Surgical History:  Procedure Laterality Date  . ABDOMINAL HYSTERECTOMY      Current Medications: No outpatient medications have been marked as taking for the 07/20/19 encounter (Appointment) with Rise Mu, PA-C.    Allergies:   Patient has no known allergies.   Social History   Socioeconomic History  . Marital status: Widowed    Spouse name: Not on file  . Number of children: Not on file  . Years of education: Not on file  . Highest education level: Not on file  Occupational History  . Not on file  Tobacco Use  . Smoking status: Never Smoker  . Smokeless tobacco: Never Used  Substance and Sexual Activity  . Alcohol use: Never  . Drug use: Never  . Sexual activity: Not on file  Other Topics Concern  . Not on file  Social History Narrative  . Not on file   Social Determinants of Health   Financial Resource Strain:   . Difficulty of Paying Living Expenses: Not on file  Food Insecurity:   . Worried About Charity fundraiser in the Last Year: Not on file  . Ran Out of Food in the Last Year: Not on file  Transportation Needs:   . Lack of Transportation (Medical): Not on file  . Lack of Transportation (Non-Medical): Not on file  Physical Activity:   . Days of Exercise per Week: Not on file  . Minutes of Exercise per Session: Not on file  Stress:   . Feeling of Stress  : Not on file  Social Connections:   . Frequency of Communication with Friends and Family: Not on file  . Frequency of Social Gatherings with Friends and Family: Not on file  . Attends Religious Services: Not on file  . Active Member of Clubs or Organizations: Not on file  . Attends Archivist Meetings: Not on file  . Marital Status: Not on file     Family History:  The patient's family history includes Cancer in her brother.  ROS:   ROS   EKGs/Labs/Other Studies Reviewed:    Studies reviewed were summarized above. The additional studies were reviewed today:  2D echo 06/15/2019:  1. Left ventricular ejection fraction, by visual estimation, is 30 %. The left ventricle has moderate to severely decreased function. There is no left ventricular hypertrophy.  2. Mildly dilated left ventricular internal cavity size.  3. The left ventricle demonstrates global hypokinesis.  4. Left ventricular diastolic parameters are consistent with Grade II diastolic dysfunction (pseudonormalization).  5. Global right ventricle has normal systolic function.The right ventricular size is normal. No increase in right ventricular wall thickness.  6. Left atrial size was normal.  7. Normal pulmonary artery systolic pressure.  8. Rhythm is normal sinus rate 87 bpm ___________  Elwyn Reach patch pending  EKG:  EKG is ordered today.  The EKG ordered today demonstrates ***  Recent Labs: 06/15/2019: ALT 22; B Natriuretic Peptide 455.0; TSH 3.241 06/16/2019: Hemoglobin 9.7; Platelets 246 06/17/2019: Magnesium 2.0 06/18/2019: BUN 11; Creatinine, Ser 0.63; Potassium 3.8; Sodium 141  Recent Lipid Panel    Component Value Date/Time   CHOL 186 06/16/2019 0638   TRIG 99 06/16/2019 0638   HDL 62 06/16/2019 0638   CHOLHDL 3.0 06/16/2019 0638   VLDL 20 06/16/2019 0638   LDLCALC 104 (H) 06/16/2019 [id number]    PHYSICAL EXAM:    VS:  There were no vitals taken for this visit.  BMI: There is no height or weight on  file to calculate BMI.  Physical Exam  Wt Readings from Last 3 Encounters:  06/15/19 160 lb (72.6 kg)  03/02/19 150 lb 9.2 oz (68.3 kg)     ASSESSMENT & PLAN:   1. ***  Disposition: F/u with Dr. Rockey Situ or an APP in ***.   Medication Adjustments/Labs and Tests Ordered: Current medicines are reviewed at length with the patient today.  Concerns regarding medicines are outlined above. Medication changes, Labs and Tests ordered today are summarized above and listed in the Patient Instructions accessible in Encounters.   Signed, Christell Faith, PA-C 07/16/2019 3:52 PM     [facility] [address] [place], [place] [address] [phone]

## 2019-07-16 NOTE — Telephone Encounter (Signed)
Called patient to ask if they have already sent back the ZIO monitor.  Patients daughter stated that patient is now at [facility] and she took the ZIO monitor off the patient.  She stated when the patient came out of the hospital the box to the ZIO monitor was not with her.   Patients daughter stated that she does have the monitor but she does not have the box to mail it back in.  Patient has an upcoming appointment scheduled for 07/20/2019.  Please advise.

## 2019-07-17 NOTE — Telephone Encounter (Signed)
Called daughter and let her know she can mail the monitor to the following address: [employer]  [address] [place] [address]   Patient is also still in the hospital at [facility] and will not be able to make appointment on 07/20/19. Daughter wanted to go ahead and cancel appointment. She is unsure when patient will be discharged and will call us back at a later time to reschedule.

## 2019-07-20 ENCOUNTER — Ambulatory Visit: Payer: Medicare Other | Admitting: Physician Assistant

## 2019-08-13 ENCOUNTER — Other Ambulatory Visit: Payer: Self-pay

## 2019-08-13 DEATH — deceased

## 2019-08-28 ENCOUNTER — Ambulatory Visit: Payer: Medicare Other | Admitting: Family

## 2020-10-17 IMAGING — CT CT ABDOMEN AND PELVIS WITHOUT CONTRAST
2 of 4 series · 15 of 46 positions shown, 17 images · non-contrast
Comparison: None.

CLINICAL DATA: Constipation, possible bowel obstruction.

EXAM:
CT ABDOMEN AND PELVIS WITHOUT CONTRAST
TECHNIQUE: Multidetector CT imaging of the abdomen and pelvis was performed
following the standard protocol without IV contrast.

[Series 2: routine abd/pel wo · axial · 0.67mm/px · z∈[-810,-395]mm · 12 of 99 slices shown, 14 images]
[im 8/99  soft-tissue]
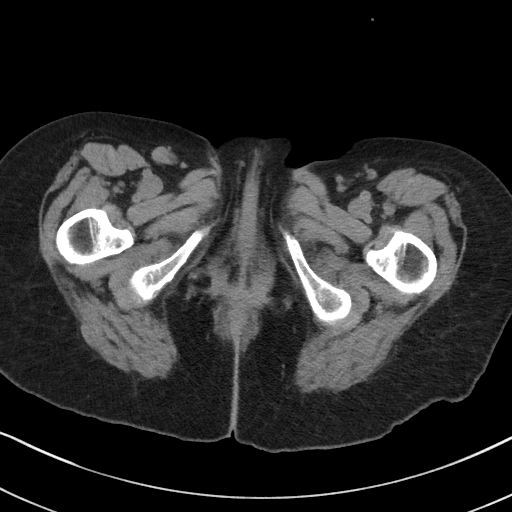
[im 8/99  bone]
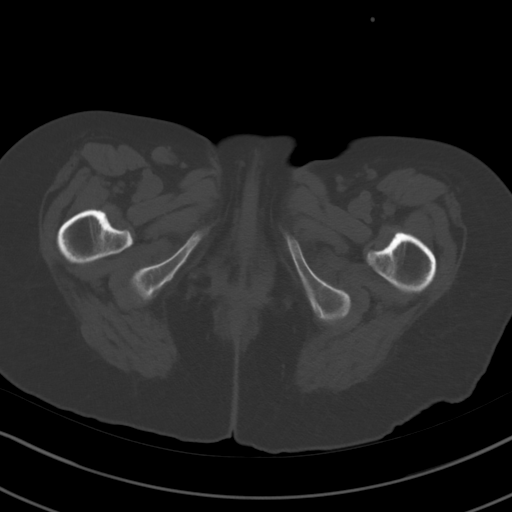
[im 16/99  soft-tissue]
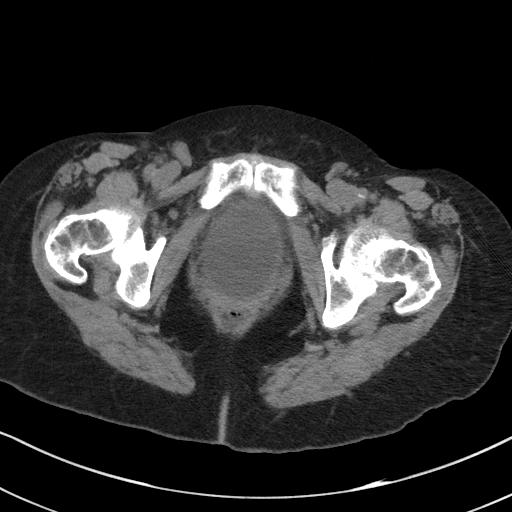
[im 23/99  soft-tissue]
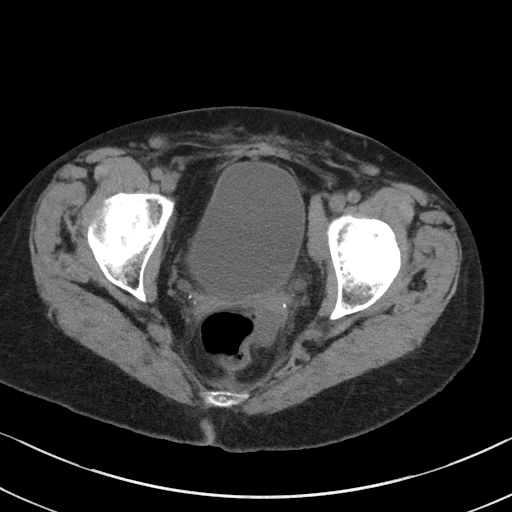
[im 31/99  soft-tissue]
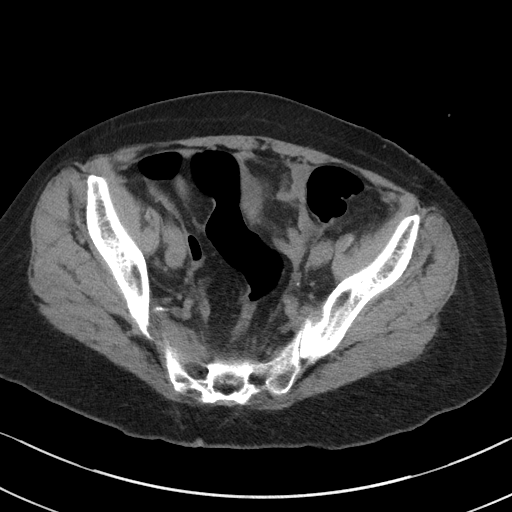
[im 38/99  soft-tissue]
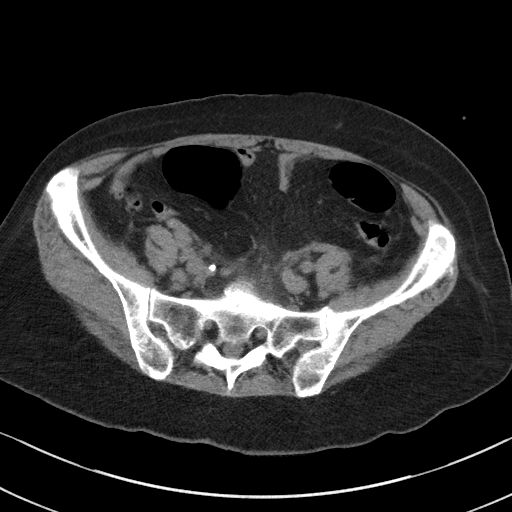
[im 46/99  soft-tissue]
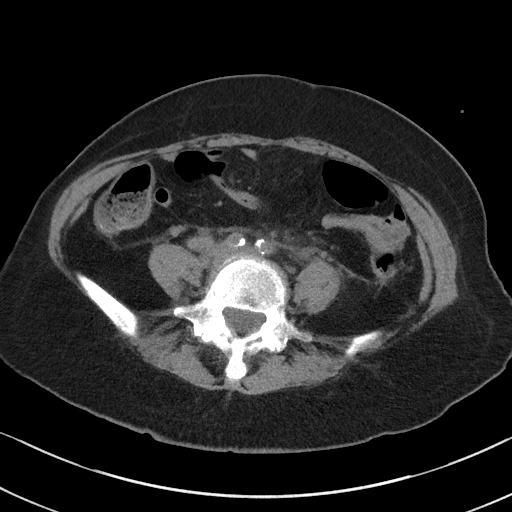
[im 53/99  soft-tissue]
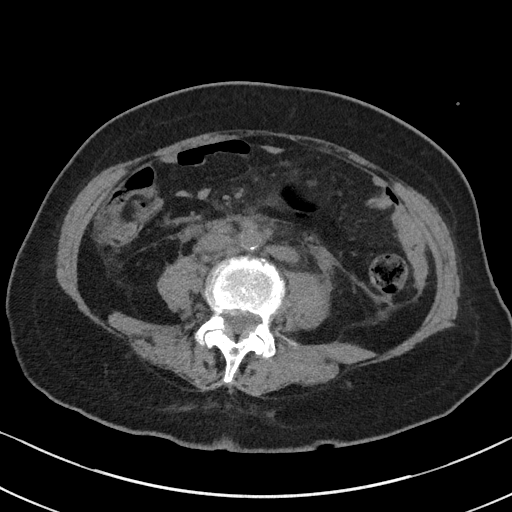
[im 61/99  soft-tissue]
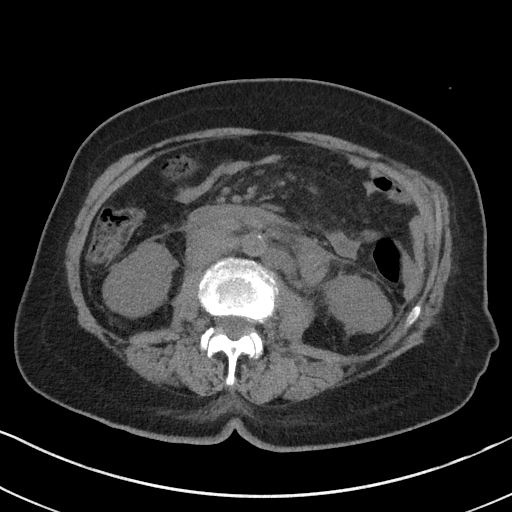
[im 68/99  soft-tissue]
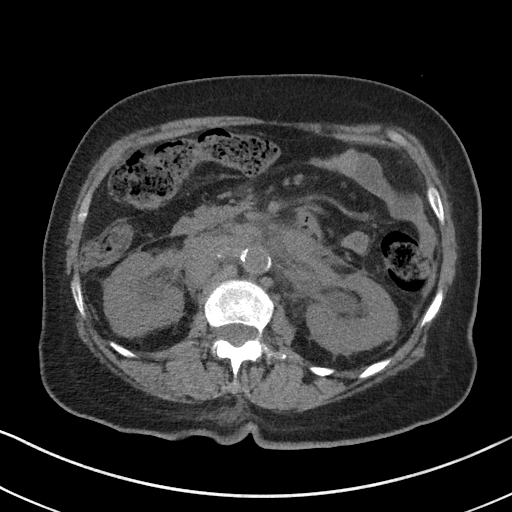
[im 68/99  bone]
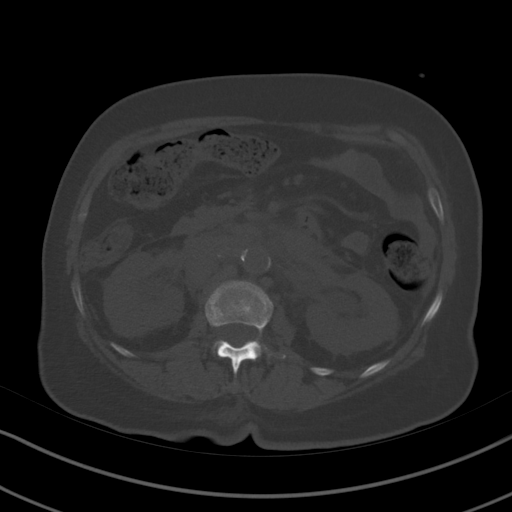
[im 76/99  soft-tissue]
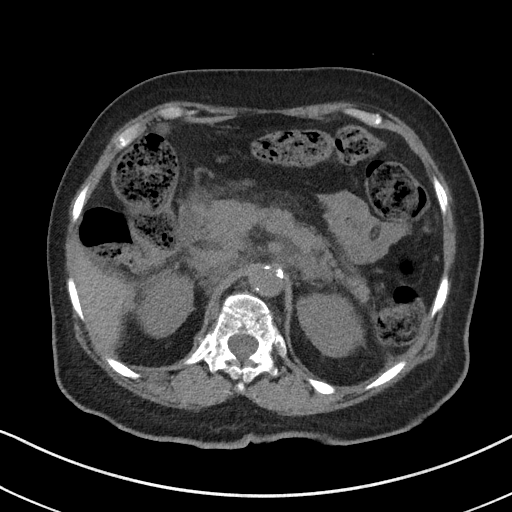
[im 83/99  soft-tissue]
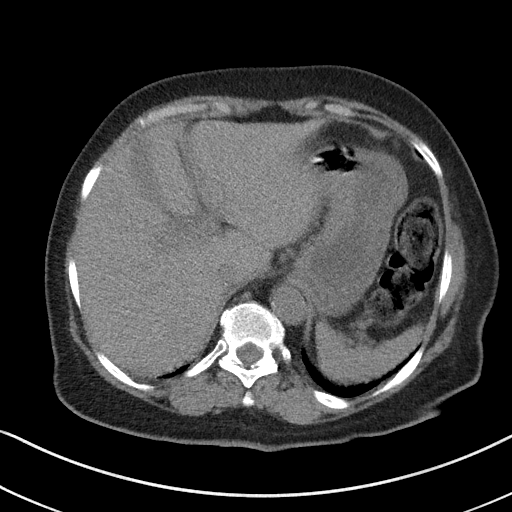
[im 91/99  soft-tissue]
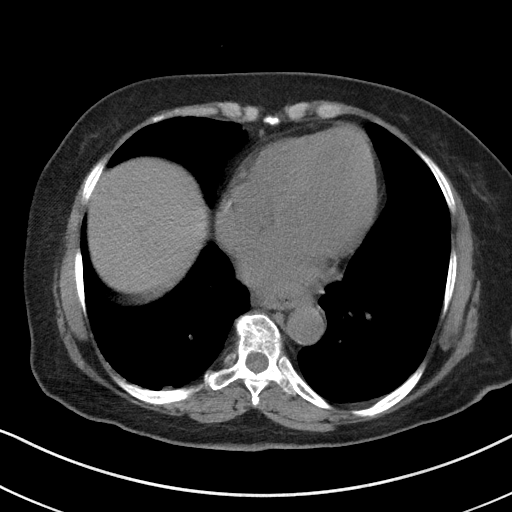

[Series 5: coronal st · coronal · 0.71mm/px · 3 of 90 slices shown]
[im 30/90  soft-tissue]
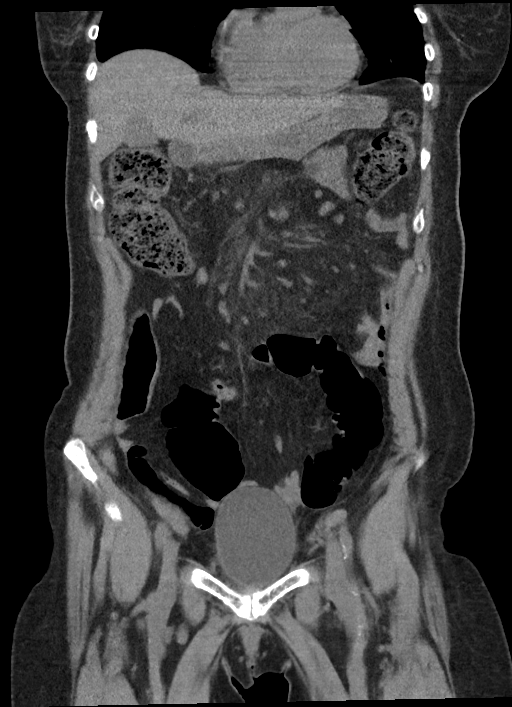
[im 40/90  soft-tissue]
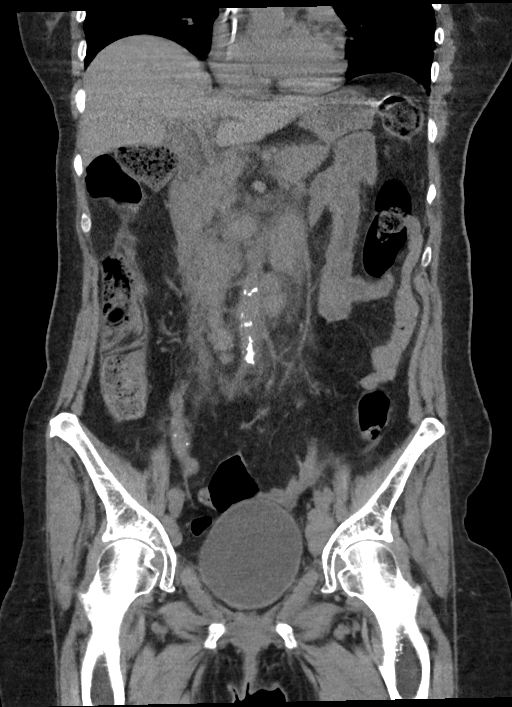
[im 50/90  soft-tissue]
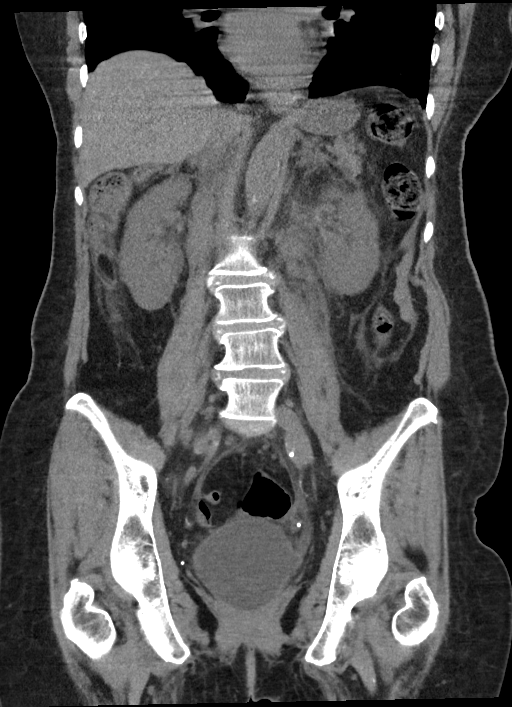

[15 of 46 positions shown; findings below may reference images not displayed]

FINDINGS: Lower chest: No acute abnormality.

Hepatobiliary: No focal liver abnormality is seen. No gallstones,
gallbladder wall thickening, or biliary dilatation.

Pancreas: Unremarkable. No pancreatic ductal dilatation or
surrounding inflammatory changes.

Spleen: Normal in size without focal abnormality.

Adrenals/Urinary Tract: Adrenal glands appear normal. Mild bilateral
hydroureteronephrosis is noted without evidence of obstructing
calculus. Minimal perinephric stranding is noted. No ureteral
calculus is noted. Urinary bladder is unremarkable.

Stomach/Bowel: Stomach is within normal limits. Appendix appears
normal. No evidence of bowel wall thickening, distention, or
inflammatory changes.

Vascular/Lymphatic: Atherosclerosis of abdominal aorta is noted
without aneurysm formation. Extensive retroperitoneal adenopathy is
noted consistent with malignancy or metastatic disease; largest
lymph node measures 2.8 cm in the right periaortic region. Also
noted is extensive adenopathy in the iliac and inguinal regions;
with the largest lymph node measuring 12 mm in the right external
iliac region.

Reproductive: Status post hysterectomy. No adnexal masses.

Other: No abdominal wall hernia or abnormality. No abdominopelvic
ascites.

Musculoskeletal: No acute or significant osseous findings.
IMPRESSION: Extensive adenopathy is noted in the retroperitoneal, bilateral
iliac and inguinal regions concerning for malignancy or metastatic
disease.

Mild bilateral hydroureteronephrosis is noted without evidence of
obstructing calculus. Minimal perinephric stranding is noted.

Aortic Atherosclerosis ([id number]).
